# Patient Record
Sex: Female | Born: 1965 | Race: White | Hispanic: No | Marital: Married | Smoking: Never smoker
Health system: Southern US, Community
[De-identification: ages and names within clinical notes are randomized; demographics above are authoritative.]

## PROBLEM LIST (undated history)

## (undated) DIAGNOSIS — N63 Unspecified lump in unspecified breast: Secondary | ICD-10-CM

## (undated) DIAGNOSIS — Z842 Family history of other diseases of the genitourinary system: Secondary | ICD-10-CM

## (undated) DIAGNOSIS — N939 Abnormal uterine and vaginal bleeding, unspecified: Secondary | ICD-10-CM

## (undated) DIAGNOSIS — N920 Excessive and frequent menstruation with regular cycle: Secondary | ICD-10-CM

## (undated) DIAGNOSIS — L900 Lichen sclerosus et atrophicus: Secondary | ICD-10-CM

## (undated) DIAGNOSIS — Z9289 Personal history of other medical treatment: Secondary | ICD-10-CM

## (undated) HISTORY — DX: Personal history of other medical treatment: Z92.89

## (undated) HISTORY — DX: Unspecified lump in unspecified breast: N63.0

## (undated) HISTORY — DX: Abnormal uterine and vaginal bleeding, unspecified: N93.9

## (undated) HISTORY — DX: Excessive and frequent menstruation with regular cycle: N92.0

## (undated) HISTORY — DX: Lichen sclerosus et atrophicus: L90.0

## (undated) HISTORY — DX: Family history of other diseases of the genitourinary system: Z84.2

---

## 1998-06-14 DIAGNOSIS — N63 Unspecified lump in unspecified breast: Secondary | ICD-10-CM

## 1998-06-14 HISTORY — DX: Unspecified lump in unspecified breast: N63.0

## 2004-05-04 ENCOUNTER — Ambulatory Visit: Payer: Self-pay | Admitting: Obstetrics and Gynecology

## 2006-06-01 ENCOUNTER — Ambulatory Visit: Payer: Self-pay | Admitting: Obstetrics and Gynecology

## 2007-08-03 ENCOUNTER — Ambulatory Visit: Payer: Self-pay | Admitting: Obstetrics and Gynecology

## 2007-08-08 ENCOUNTER — Ambulatory Visit: Payer: Self-pay | Admitting: Obstetrics and Gynecology

## 2008-09-24 ENCOUNTER — Ambulatory Visit: Payer: Self-pay

## 2009-10-07 ENCOUNTER — Ambulatory Visit: Payer: Self-pay | Admitting: Obstetrics and Gynecology

## 2010-10-15 ENCOUNTER — Ambulatory Visit: Payer: Self-pay

## 2013-11-08 ENCOUNTER — Ambulatory Visit: Payer: Self-pay

## 2014-03-19 ENCOUNTER — Ambulatory Visit: Payer: Self-pay | Admitting: Obstetrics and Gynecology

## 2014-03-19 LAB — COMPREHENSIVE METABOLIC PANEL
ALT: 26 U/L
Albumin: 4 g/dL (ref 3.4–5.0)
Alkaline Phosphatase: 72 U/L
Anion Gap: 6 — ABNORMAL LOW (ref 7–16)
BUN: 15 mg/dL (ref 7–18)
Bilirubin,Total: 0.3 mg/dL (ref 0.2–1.0)
Calcium, Total: 9.2 mg/dL (ref 8.5–10.1)
Chloride: 104 mmol/L (ref 98–107)
Co2: 29 mmol/L (ref 21–32)
Creatinine: 0.89 mg/dL (ref 0.60–1.30)
EGFR (African American): >60
GLUCOSE: 98 mg/dL (ref 65–99)
Osmolality: 278 (ref 275–301)
Potassium: 3.5 mmol/L (ref 3.5–5.1)
SGOT(AST): 20 U/L (ref 15–37)
SODIUM: 139 mmol/L (ref 136–145)
TOTAL PROTEIN: 7.5 g/dL (ref 6.4–8.2)

## 2014-03-19 LAB — CBC
HCT: 39.1 % (ref 35.0–47.0)
HGB: 12.5 g/dL (ref 12.0–16.0)
MCH: 30.9 pg (ref 26.0–34.0)
MCHC: 31.8 g/dL — ABNORMAL LOW (ref 32.0–36.0)
MCV: 97 fL (ref 80–100)
PLATELETS: 225 10*3/uL (ref 150–440)
RBC: 4.03 10*6/uL (ref 3.80–5.20)
RDW: 12.7 % (ref 11.5–14.5)
WBC: 6 10*3/uL (ref 3.6–11.0)

## 2014-03-26 ENCOUNTER — Ambulatory Visit: Payer: Self-pay | Admitting: Obstetrics and Gynecology

## 2014-03-26 HISTORY — PX: OTHER SURGICAL HISTORY: SHX169

## 2014-03-26 HISTORY — PX: COMBINED HYSTEROSCOPY DIAGNOSTIC / D&C: SUR297

## 2014-03-28 LAB — PATHOLOGY REPORT

## 2014-07-11 HISTORY — PX: COLONOSCOPY: SHX174

## 2014-10-05 NOTE — Op Note (Signed)
PATIENT NAME:  Stacey Case, Stacey Case MR#:  [id number] DATE OF BIRTH:  [birth year]  DATE OF PROCEDURE:  03/26/2014  PREOPERATIVE DIAGNOSES:  1. Menorrhagia with regular cycles.  2. Endometrial polyp.  3. Cervical stenosis.   POSTOPERATIVE DIAGNOSES:  1. Menorrhagia with regular cycles.  2. Endometrial polyp.  3. Cervical stenosis.    PROCEDURES:  1. Dilation and curettage. 2. Hysteroscopy  3. Polypectomy of the endocervix.   ANESTHESIA: General.   SURGEON: Will Bonnet, M.D.    SURGEON: Beacher May, PA-S.  ESTIMATED BLOOD LOSS: 25 mL.   OPERATIVE FLUIDS: 400 mL.   COMPLICATIONS: None.   FINDINGS:  1. Small endocervical polyp.  2. Fluffy endometrium.  3. Stenotic external os of the cervix.   SPECIMENS: Endometrial curettings with endocervical polyp.   CONDITION AT THE END OF THE PROCEDURE: Stable.  FLUID DEFICIT: Negative 10.   URINE OUTPUT: 400 mL clear urine.   PROCEDURE IN DETAIL: The patient was taken to the operating room where general endotracheal anesthesia was administered and found to be adequate. The patient was placed in the dorsal supine high lithotomy position and prepped and draped in the usual sterile fashion. Care was taken to position the patient such that nerve injury risk was minimized. A timeout was called and the patient's bladder was emptied using in and out catheterization. A sterile speculum was placed in the vagina and a single-tooth tenaculum was used to grasp the anterior lip of the cervix. The cervix was then dilated gently in a serial fashion initially using lacrimal duct probes starting with the smallest probe going to the larger size, and then followed by Hegar dilators up to a dilatation of 6 mm. This was accomplished without difficulty. The MyoSure hysteroscope was then introduced through the  endocervix with the above-noted findings. It was then introduced into the uterine cavity with the above-noted findings. The MyoSure device was then  attached to the hysteroscope and the polyp was removed using the MyoSure device, and a sampling of the lining of the endometrium was taken with the MyoSure device, as well.   The device was removed and a curette was used to perform a gentle sampling of the endometrium. The hysteroscope was reintroduced into the cervix and introduced to the uterus to verify no trauma to the uterine cavity and this was verified. Hemostasis was also noted. This terminated the procedure.   The patient tolerated the procedure well. Sponge, lap, and needle counts were correct x 2. For VTE prophylaxis, the patient was wearing pneumatic compression stockings, which were operating throughout the entire procedure. She was awakened in the operating room and taken to the PAC-U in stable condition.     ____________________________ Will Bonnet, MD sdj:JT D: 03/26/2014 09:57:08 ET T: 03/26/2014 10:37:23 ET JOB#: [id number]  cc: Will Bonnet, MD, <Dictator> Will Bonnet MD ELECTRONICALLY SIGNED 04/02/2014 18:55

## 2014-10-29 LAB — HM PAP SMEAR

## 2014-11-04 ENCOUNTER — Other Ambulatory Visit: Payer: Self-pay | Admitting: Unknown Physician Specialty

## 2014-11-04 DIAGNOSIS — R928 Other abnormal and inconclusive findings on diagnostic imaging of breast: Secondary | ICD-10-CM

## 2014-11-08 ENCOUNTER — Ambulatory Visit
Admission: RE | Admit: 2014-11-08 | Discharge: 2014-11-08 | Disposition: A | Discharge status: Home / Self Care | Payer: BC Managed Care – PPO | Source: Ambulatory Visit | Attending: Unknown Physician Specialty | Admitting: Unknown Physician Specialty

## 2014-11-08 DIAGNOSIS — R928 Other abnormal and inconclusive findings on diagnostic imaging of breast: Secondary | ICD-10-CM

## 2014-11-08 DIAGNOSIS — N63 Unspecified lump in breast: Secondary | ICD-10-CM | POA: Insufficient documentation

## 2014-11-08 DIAGNOSIS — R921 Mammographic calcification found on diagnostic imaging of breast: Secondary | ICD-10-CM | POA: Insufficient documentation

## 2014-11-12 ENCOUNTER — Other Ambulatory Visit: Payer: Self-pay | Admitting: Unknown Physician Specialty

## 2014-11-12 DIAGNOSIS — R921 Mammographic calcification found on diagnostic imaging of breast: Secondary | ICD-10-CM

## 2014-11-13 ENCOUNTER — Other Ambulatory Visit: Payer: Self-pay | Admitting: Unknown Physician Specialty

## 2014-11-13 DIAGNOSIS — R921 Mammographic calcification found on diagnostic imaging of breast: Secondary | ICD-10-CM

## 2014-11-19 ENCOUNTER — Ambulatory Visit
Admission: RE | Admit: 2014-11-19 | Discharge: 2014-11-19 | Disposition: A | Discharge status: Home / Self Care | Payer: BC Managed Care – PPO | Source: Ambulatory Visit | Attending: Unknown Physician Specialty | Admitting: Unknown Physician Specialty

## 2014-11-19 DIAGNOSIS — R921 Mammographic calcification found on diagnostic imaging of breast: Secondary | ICD-10-CM

## 2014-11-19 DIAGNOSIS — R928 Other abnormal and inconclusive findings on diagnostic imaging of breast: Secondary | ICD-10-CM | POA: Diagnosis present

## 2014-11-19 DIAGNOSIS — R920 Mammographic microcalcification found on diagnostic imaging of breast: Secondary | ICD-10-CM | POA: Diagnosis not present

## 2014-11-19 DIAGNOSIS — D242 Benign neoplasm of left breast: Secondary | ICD-10-CM | POA: Diagnosis not present

## 2014-11-19 HISTORY — PX: BREAST BIOPSY: SHX20

## 2014-11-20 LAB — SURGICAL PATHOLOGY

## 2015-10-29 ENCOUNTER — Other Ambulatory Visit: Payer: Self-pay | Admitting: Obstetrics and Gynecology

## 2015-10-29 DIAGNOSIS — Z1231 Encounter for screening mammogram for malignant neoplasm of breast: Secondary | ICD-10-CM

## 2015-11-12 ENCOUNTER — Ambulatory Visit
Admission: RE | Admit: 2015-11-12 | Discharge: 2015-11-12 | Disposition: A | Discharge status: Home / Self Care | Payer: BC Managed Care – PPO | Source: Ambulatory Visit | Attending: Obstetrics and Gynecology | Admitting: Obstetrics and Gynecology

## 2015-11-12 DIAGNOSIS — Z1231 Encounter for screening mammogram for malignant neoplasm of breast: Secondary | ICD-10-CM | POA: Insufficient documentation

## 2015-11-12 LAB — HM MAMMOGRAPHY

## 2016-09-30 ENCOUNTER — Other Ambulatory Visit: Payer: Self-pay | Admitting: Obstetrics and Gynecology

## 2016-09-30 DIAGNOSIS — Z1231 Encounter for screening mammogram for malignant neoplasm of breast: Secondary | ICD-10-CM

## 2016-10-27 ENCOUNTER — Telehealth: Payer: Self-pay | Admitting: Obstetrics and Gynecology

## 2016-10-27 DIAGNOSIS — Z13220 Encounter for screening for lipoid disorders: Secondary | ICD-10-CM

## 2016-10-27 DIAGNOSIS — R7303 Prediabetes: Secondary | ICD-10-CM

## 2016-10-27 DIAGNOSIS — Z0000 Encounter for general adult medical examination without abnormal findings: Secondary | ICD-10-CM

## 2016-10-27 NOTE — Telephone Encounter (Signed)
Patient has appt. For Annual on 5/31 , however she would like to know if she can come in before then to get labs done so the results are here at the time of her appt.  Please advise.   If so we need to put her on the lab schedule at some point.   Thanks.

## 2016-10-27 NOTE — Telephone Encounter (Signed)
Left msg for pt to schedule lab appt prior to her appt.

## 2016-10-27 NOTE — Telephone Encounter (Signed)
RN to notify pt she can get fasting labs done at our office at her convenience. Pt to sched lab appt.

## 2016-10-28 NOTE — Telephone Encounter (Signed)
Pt scheduled for lab appt 11/01/16

## 2016-11-01 ENCOUNTER — Other Ambulatory Visit: Payer: BC Managed Care – PPO

## 2016-11-01 DIAGNOSIS — Z0000 Encounter for general adult medical examination without abnormal findings: Secondary | ICD-10-CM

## 2016-11-01 DIAGNOSIS — R7303 Prediabetes: Secondary | ICD-10-CM

## 2016-11-01 DIAGNOSIS — Z13220 Encounter for screening for lipoid disorders: Secondary | ICD-10-CM

## 2016-11-02 LAB — COMPREHENSIVE METABOLIC PANEL
ALK PHOS: 61 IU/L (ref 39–117)
ALT: 18 IU/L (ref 0–32)
AST: 17 IU/L (ref 0–40)
Albumin/Globulin Ratio: 1.5 (ref 1.2–2.2)
Albumin: 4.4 g/dL (ref 3.5–5.5)
BILIRUBIN TOTAL: 0.4 mg/dL (ref 0.0–1.2)
BUN / CREAT RATIO: 18 (ref 9–23)
BUN: 16 mg/dL (ref 6–24)
CO2: 22 mmol/L (ref 18–29)
Calcium: 9.7 mg/dL (ref 8.7–10.2)
Chloride: 101 mmol/L (ref 96–106)
Creatinine, Ser: 0.91 mg/dL (ref 0.57–1.00)
GFR calc Af Amer: 86 mL/min/{1.73_m2} (ref >59)
GFR calc non Af Amer: 74 mL/min/{1.73_m2} (ref >59)
Globulin, Total: 2.9 g/dL (ref 1.5–4.5)
Glucose: 82 mg/dL (ref 65–99)
POTASSIUM: 4.4 mmol/L (ref 3.5–5.2)
Sodium: 140 mmol/L (ref 134–144)
Total Protein: 7.3 g/dL (ref 6.0–8.5)

## 2016-11-02 LAB — LIPID PANEL
CHOLESTEROL TOTAL: 164 mg/dL (ref 100–199)
Chol/HDL Ratio: 2.7 ratio (ref 0.0–4.4)
HDL: 61 mg/dL (ref >39)
LDL Calculated: 85 mg/dL (ref 0–99)
Triglycerides: 88 mg/dL (ref 0–149)
VLDL Cholesterol Cal: 18 mg/dL (ref 5–40)

## 2016-11-02 LAB — HEMOGLOBIN A1C
ESTIMATED AVERAGE GLUCOSE: 120 mg/dL
Hgb A1c MFr Bld: 5.8 % — ABNORMAL HIGH (ref 4.8–5.6)

## 2016-11-11 ENCOUNTER — Ambulatory Visit: Payer: Self-pay | Admitting: Obstetrics and Gynecology

## 2016-11-15 ENCOUNTER — Ambulatory Visit
Admission: RE | Admit: 2016-11-15 | Discharge: 2016-11-15 | Disposition: A | Discharge status: Home / Self Care | Payer: BC Managed Care – PPO | Source: Ambulatory Visit | Attending: Obstetrics and Gynecology | Admitting: Obstetrics and Gynecology

## 2016-11-15 DIAGNOSIS — Z1231 Encounter for screening mammogram for malignant neoplasm of breast: Secondary | ICD-10-CM | POA: Insufficient documentation

## 2016-11-16 ENCOUNTER — Encounter: Payer: Self-pay | Admitting: Obstetrics and Gynecology

## 2016-12-22 ENCOUNTER — Ambulatory Visit (INDEPENDENT_AMBULATORY_CARE_PROVIDER_SITE_OTHER): Payer: BC Managed Care – PPO | Admitting: Obstetrics and Gynecology

## 2016-12-22 ENCOUNTER — Encounter: Payer: Self-pay | Admitting: Obstetrics and Gynecology

## 2016-12-22 VITALS — BP 110/66 | HR 80 | Ht 65.0 in | Wt 200.0 lb

## 2016-12-22 DIAGNOSIS — Z01419 Encounter for gynecological examination (general) (routine) without abnormal findings: Secondary | ICD-10-CM | POA: Diagnosis not present

## 2016-12-22 DIAGNOSIS — N938 Other specified abnormal uterine and vaginal bleeding: Secondary | ICD-10-CM

## 2016-12-22 DIAGNOSIS — Z1231 Encounter for screening mammogram for malignant neoplasm of breast: Secondary | ICD-10-CM | POA: Diagnosis not present

## 2016-12-22 DIAGNOSIS — Z1239 Encounter for other screening for malignant neoplasm of breast: Secondary | ICD-10-CM

## 2016-12-22 NOTE — Progress Notes (Signed)
Chief Complaint  Patient presents with  . Gynecologic Exam    spotty bleeding    HPI:      Ms. Stacey Case is a 51 y.o. G0P0000 who LMP was Patient's last menstrual period was 12/06/2016., presents today for her annual examination.  Her menses are regular every 30-40 days, lasting 5-7 days.  Dysmenorrhea none. She does not have intermenstrual bleeding. She has had DUB this month only. Menses started 12/06/16 and was spotting for about 2 wks, then real flow. Bleeding is just now stopping. She has a hx of endocx polyp with polypectomy 2016 with Dr. Glennon Mac.  Sex activity: single partner, contraception - none. Hx of infertility. She does not have vaginal dryness.  Last Pap: Oct 29, 2014  Results were: no abnormalities /neg HPV DNA.  Hx of STDs: none  Last mammogram: 11/15/16. 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 Results were: normal--routine follow-up in 12 months. There is no FH of breast cancer. There is no FH of ovarian cancer. The patient does do self-breast exams.  Colonoscopy: colonoscopy 2 years ago without abnormalities. FH of polyps so has colonoscopy Q5 yrs.   Tobacco use: The patient denies current or previous tobacco use. Alcohol use: none Exercise: moderately active  She does get adequate calcium and Vitamin D in her diet.   Past Medical History:  Diagnosis Date  . Abnormal uterine bleeding (AUB)   . Breast mass in female 2000   LEFT  . FH: infertility   . History of mammogram 4/22/14L 11/12/15   BIRAD 1; NEG  . History of Papanicolaou smear of cervix 09/20/11; 10/29/14   -/-; -/-  . Lichen sclerosus    VULVA  . Menorrhagia     Past Surgical History:  Procedure Laterality Date  . BREAST BIOPSY Left 11/19/2014   neg  . COLONOSCOPY  07/11/2014   POLYPS - HYPERPLASIA; DR. Aleen Sells  . COMBINED HYSTEROSCOPY DIAGNOSTIC / D&C  03/26/2014   SDJ  . ENDOCERVICAL POLYPECTOMY  03/26/2014   SDJ    Family History  Problem Relation Age of Onset  . Diabetes Maternal Grandfather     TYPE 2  . Breast cancer Neg Hx     Social History   Social History  . Marital status: Married    Spouse name: N/A  . Number of children: 1  . Years of education: 58   Occupational History  . teacher    Social History Main Topics  . Smoking status: Never Smoker  . Smokeless tobacco: Never Used  . Alcohol use No  . Drug use: No  . Sexual activity: Yes    Birth control/ protection: None   Other Topics Concern  . Not on file   Social History Narrative  . No narrative on file    No current outpatient prescriptions on file.   ROS:  Review of Systems  Constitutional: Negative for fatigue, fever and unexpected weight change.  Respiratory: Negative for cough, shortness of breath and wheezing.   Cardiovascular: Negative for chest pain, palpitations and leg swelling.  Gastrointestinal: Negative for blood in stool, constipation, diarrhea, nausea and vomiting.  Endocrine: Negative for cold intolerance, heat intolerance and polyuria.  Genitourinary: Positive for menstrual problem. Negative for dyspareunia, dysuria, flank pain, frequency, genital sores, hematuria, pelvic pain, urgency, vaginal bleeding, vaginal discharge and vaginal pain.  Musculoskeletal: Negative for back pain, joint swelling and myalgias.  Skin: Negative for rash.  Neurological: Negative for dizziness, syncope, light-headedness, numbness and headaches.  Hematological: Negative for adenopathy.  Psychiatric/Behavioral: Negative for agitation, confusion, sleep disturbance and suicidal ideas. The patient is not nervous/anxious.      Objective: BP 110/66 (BP Location: Left Arm, Patient Position: Sitting, Cuff Size: Normal)   Pulse 80   Ht 5\' 5"  (1.651 m)   Wt 200 lb (90.7 kg)   LMP 12/06/2016   BMI 33.28 kg/m    Physical Exam  Constitutional: She is oriented to person, place, and time. She appears well-developed and well-nourished.  Genitourinary: Vagina normal and uterus normal. There is no rash or  tenderness on the right labia. There is no rash or tenderness on the left labia. No erythema or tenderness in the vagina. No vaginal discharge found. Right adnexum does not display mass and does not display tenderness. Left adnexum does not display mass and does not display tenderness. Cervix does not exhibit motion tenderness or polyp. Uterus is not enlarged or tender.  Neck: Normal range of motion. No thyromegaly present.  Cardiovascular: Normal rate, regular rhythm and normal heart sounds.   No murmur heard. Pulmonary/Chest: Effort normal and breath sounds normal. Right breast exhibits no mass, no nipple discharge, no skin change and no tenderness. Left breast exhibits no mass, no nipple discharge, no skin change and no tenderness.  Abdominal: Soft. There is no tenderness. There is no guarding.  Musculoskeletal: Normal range of motion.  Neurological: She is alert and oriented to person, place, and time. No cranial nerve deficit.  Psychiatric: She has a normal mood and affect. Her behavior is normal.  Vitals reviewed.   Assessment/Plan:  Encounter for annual routine gynecological examination  Screening for breast cancer - Pt had mammo 6/18.  DUB (dysfunctional uterine bleeding) - This month only. Probably due to anovulatory bleeding. Hx of endocx polyp with polypectomy in the past. Pt to f/u if sx persist for u/s and labs.          GYN counsel mammography screening, menopause, adequate intake of calcium and vitamin D, diet and exercise     F/U  Return in about 1 year (around 12/22/2017).  Georgene Kopper B. Talyn Dessert, PA-C 12/22/2016 3:14 PM

## 2016-12-30 ENCOUNTER — Telehealth: Payer: Self-pay

## 2016-12-30 NOTE — Telephone Encounter (Signed)
After phonetag, spoke c pt and adv there were no results.  She is not due for pap until next year.  Pt just wanted to be sure.

## 2016-12-30 NOTE — Telephone Encounter (Signed)
Pt calling for results from the 11th.  No labs done. Pap due 2019. [phone] [facility].

## 2017-08-18 ENCOUNTER — Telehealth: Payer: Self-pay

## 2017-08-18 NOTE — Telephone Encounter (Signed)
Pt started cycle 08/09/17 and is still bleeding. She was about 10-15 days later starting this month. Pt isn't bleeding heavy or having to change pad more than 1x qh. Pt aware [facility] out of office til Monday. Ok to wait, just passing info along as requested.

## 2017-08-18 NOTE — Telephone Encounter (Signed)
Pt states she was to notify [facility] if she was having any lengthy time between cycles. [phone]

## 2017-08-22 ENCOUNTER — Other Ambulatory Visit: Payer: Self-pay | Admitting: Obstetrics and Gynecology

## 2017-08-22 DIAGNOSIS — N938 Other specified abnormal uterine and vaginal bleeding: Secondary | ICD-10-CM

## 2017-08-22 NOTE — Telephone Encounter (Signed)
Pt needs labs and GYN u/s for DUB. Orders placed. RN to notify pt and get her to sched. Thx.

## 2017-08-22 NOTE — Progress Notes (Signed)
Pt with DUB sx again. Due for labs/GYN u/s. [facility] will call pt with results.

## 2017-08-24 NOTE — Telephone Encounter (Signed)
Left detailed msg for pt to call and schedule lab and u/s.

## 2017-08-29 ENCOUNTER — Other Ambulatory Visit: Payer: BC Managed Care – PPO

## 2017-08-30 ENCOUNTER — Other Ambulatory Visit: Payer: BC Managed Care – PPO

## 2017-08-30 ENCOUNTER — Ambulatory Visit (INDEPENDENT_AMBULATORY_CARE_PROVIDER_SITE_OTHER): Payer: BC Managed Care – PPO

## 2017-08-30 DIAGNOSIS — N938 Other specified abnormal uterine and vaginal bleeding: Secondary | ICD-10-CM

## 2017-08-31 ENCOUNTER — Telehealth: Payer: Self-pay | Admitting: Obstetrics and Gynecology

## 2017-08-31 LAB — TSH+FREE T4
FREE T4: 1.06 ng/dL (ref 0.82–1.77)
TSH: 2.58 u[IU]/mL (ref 0.450–4.500)

## 2017-08-31 LAB — PROLACTIN: Prolactin: 10.6 ng/mL (ref 4.8–23.3)

## 2017-08-31 NOTE — Telephone Encounter (Addendum)
Pt aware of normal DUB labs. Pt had u/s yesterday which showed poss endocx polyp. EM =2.8 mm. Hx of endocx polyp rem in past with Dr. Glennon Mac. Had DUB sx 7/18 and again 2/19--menses lasted 10 days. Pt wants to watch and wait with sx and will f/u in a couple months at annual.

## 2017-10-25 ENCOUNTER — Other Ambulatory Visit: Payer: Self-pay | Admitting: Obstetrics and Gynecology

## 2017-12-26 ENCOUNTER — Encounter: Payer: Self-pay | Admitting: Obstetrics and Gynecology

## 2017-12-26 ENCOUNTER — Other Ambulatory Visit (HOSPITAL_COMMUNITY)
Admission: RE | Admit: 2017-12-26 | Discharge: 2017-12-26 | Disposition: A | Discharge status: Home / Self Care | Payer: BC Managed Care – PPO | Source: Ambulatory Visit | Attending: Obstetrics and Gynecology | Admitting: Obstetrics and Gynecology

## 2017-12-26 ENCOUNTER — Ambulatory Visit (INDEPENDENT_AMBULATORY_CARE_PROVIDER_SITE_OTHER): Payer: BC Managed Care – PPO | Admitting: Obstetrics and Gynecology

## 2017-12-26 VITALS — BP 130/80 | HR 68 | Ht 66.0 in | Wt 212.0 lb

## 2017-12-26 DIAGNOSIS — N938 Other specified abnormal uterine and vaginal bleeding: Secondary | ICD-10-CM | POA: Diagnosis not present

## 2017-12-26 DIAGNOSIS — Z124 Encounter for screening for malignant neoplasm of cervix: Secondary | ICD-10-CM | POA: Insufficient documentation

## 2017-12-26 DIAGNOSIS — Z01411 Encounter for gynecological examination (general) (routine) with abnormal findings: Secondary | ICD-10-CM | POA: Diagnosis not present

## 2017-12-26 DIAGNOSIS — Z1151 Encounter for screening for human papillomavirus (HPV): Secondary | ICD-10-CM | POA: Insufficient documentation

## 2017-12-26 DIAGNOSIS — N841 Polyp of cervix uteri: Secondary | ICD-10-CM | POA: Diagnosis not present

## 2017-12-26 DIAGNOSIS — Z1231 Encounter for screening mammogram for malignant neoplasm of breast: Secondary | ICD-10-CM | POA: Diagnosis not present

## 2017-12-26 DIAGNOSIS — Z0000 Encounter for general adult medical examination without abnormal findings: Secondary | ICD-10-CM

## 2017-12-26 DIAGNOSIS — Z1239 Encounter for other screening for malignant neoplasm of breast: Secondary | ICD-10-CM

## 2017-12-26 DIAGNOSIS — R490 Dysphonia: Secondary | ICD-10-CM

## 2017-12-26 DIAGNOSIS — Z01419 Encounter for gynecological examination (general) (routine) without abnormal findings: Secondary | ICD-10-CM

## 2017-12-26 DIAGNOSIS — Z13220 Encounter for screening for lipoid disorders: Secondary | ICD-10-CM | POA: Diagnosis not present

## 2017-12-26 DIAGNOSIS — Z131 Encounter for screening for diabetes mellitus: Secondary | ICD-10-CM

## 2017-12-26 NOTE — Patient Instructions (Signed)
I value your feedback and entrusting us with your care. If you get a [facility] patient survey, I would appreciate you taking the time to let us know about your experience today. Thank you! 

## 2017-12-26 NOTE — Progress Notes (Signed)
S   Chief Complaint  Patient presents with  . Gynecologic Exam    Discharge after period      HPI:      Stacey Case is a 52 y.o. G0P0000 who LMP was Patient's last menstrual period was 12/14/2017 (approximate)., presents today for her annual examination. Her menses are regular every 30-40 days, lasting 5-7 days.  Dysmenorrhea and HA day before. She has had occas intermenstrual bleeding since last yr. Normal thyroid labs. Has a hx of endocx polyp with polypectomy 2016 with Dr. Glennon Mac. GYN u/s 3/19 showed "Avascular area in cervix, possible cervical polyp measures 1.71 x 1.27 cm. Endometrium measures 2.82 mm". Pt had wanted to follow sx. Started spotting again today. She does have vasomotor sx.   Sex activity: single partner, contraception - none. Hx of infertility. She does not have vaginal dryness.  Last Pap: Oct 29, 2014  Results were: no abnormalities /neg HPV DNA.  Hx of STDs: none  Last mammogram: 11/15/16. 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 Results were: normal--routine follow-up in 12 months. There is no FH of breast cancer. There is no FH of ovarian cancer. The patient does do self-breast exams.  Colonoscopy: colonoscopy 3 years ago without abnormalities. FH of polyps so has colonoscopy Q5 yrs.   Tobacco use: The patient denies current or previous tobacco use. Alcohol use: none Exercise: moderately active  She does get adequate calcium and Vitamin D in her diet. WNL lipids 5/18 but mild pre-DM. Due for lab rechk.  Doesn't have PCP.  Has had throat hoarseness intermittently recently. Has noted some nasal drainage. No meds taken. Had GERD sx last night but that is unusual for pt. Had normal thyroid 3/19.     Past Medical History:  Diagnosis Date  . Abnormal uterine bleeding (AUB)   . Breast mass in female 2000   LEFT  . FH: infertility   . History of mammogram 4/22/14L 11/12/15   BIRAD 1; NEG  . History of Papanicolaou smear of cervix 09/20/11; 10/29/14   -/-; -/-  .  Lichen sclerosus    VULVA  . Menorrhagia     Past Surgical History:  Procedure Laterality Date  . BREAST BIOPSY Left 11/19/2014   neg  . COLONOSCOPY  07/11/2014   POLYPS - HYPERPLASIA; DR. Aleen Sells  . COMBINED HYSTEROSCOPY DIAGNOSTIC / D&C  03/26/2014   SDJ  . ENDOCERVICAL POLYPECTOMY  03/26/2014   SDJ    Family History  Problem Relation Age of Onset  . Diabetes Maternal Grandfather        TYPE 2  . Breast cancer Neg Hx     Social History   Socioeconomic History  . Marital status: Married    Spouse name: Not on file  . Number of children: 1  . Years of education: 30  . Highest education level: Not on file  Occupational History  . Occupation: Pharmacist, hospital  Social Needs  . Financial resource strain: Not on file  . Food insecurity:    Worry: Not on file    Inability: Not on file  . Transportation needs:    Medical: Not on file    Non-medical: Not on file  Tobacco Use  . Smoking status: Never Smoker  . Smokeless tobacco: Never Used  Substance and Sexual Activity  . Alcohol use: No  . Drug use: No  . Sexual activity: Yes    Birth control/protection: None  Lifestyle  . Physical activity:    Days per week: Not on file  Minutes per session: Not on file  . Stress: Not on file  Relationships  . Social connections:    Talks on phone: Not on file    Gets together: Not on file    Attends religious service: Not on file    Active member of club or organization: Not on file    Attends meetings of clubs or organizations: Not on file    Relationship status: Not on file  . Intimate partner violence:    Fear of current or ex partner: Not on file    Emotionally abused: Not on file    Physically abused: Not on file    Forced sexual activity: Not on file  Other Topics Concern  . Not on file  Social History Narrative  . Not on file    Current Outpatient Medications on File Prior to Visit  Medication Sig Dispense Refill  . Multiple Vitamin (MULTI-VITAMIN DAILY) TABS Take  by mouth.     No current facility-administered medications on file prior to visit.     ROS:  Review of Systems  Constitutional: Negative for fatigue, fever and unexpected weight change.  Respiratory: Negative for cough, shortness of breath and wheezing.   Cardiovascular: Negative for chest pain, palpitations and leg swelling.  Gastrointestinal: Negative for blood in stool, constipation, diarrhea, nausea and vomiting.  Endocrine: Negative for cold intolerance, heat intolerance and polyuria.  Genitourinary: Positive for vaginal bleeding. Negative for dyspareunia, dysuria, flank pain, frequency, genital sores, hematuria, menstrual problem, pelvic pain, urgency, vaginal discharge and vaginal pain.  Musculoskeletal: Negative for back pain, joint swelling and myalgias.  Skin: Negative for rash.  Neurological: Negative for dizziness, syncope, light-headedness, numbness and headaches.  Hematological: Negative for adenopathy.  Psychiatric/Behavioral: Negative for agitation, confusion, sleep disturbance and suicidal ideas. The patient is not nervous/anxious.      Objective: BP 130/80   Pulse 68   Ht 5\' 6"  (1.676 m)   Wt 212 lb (96.2 kg)   LMP 12/14/2017 (Approximate)   BMI 34.22 kg/m    Physical Exam  Constitutional: She is oriented to person, place, and time. She appears well-developed and well-nourished.  Genitourinary: Uterus normal. There is no rash or tenderness on the right labia. There is no rash or tenderness on the left labia. There is bleeding in the vagina. No erythema or tenderness in the vagina. No vaginal discharge found. Right adnexum does not display mass and does not display tenderness. Left adnexum does not display mass and does not display tenderness. Cervix does not exhibit motion tenderness or polyp. Uterus is not enlarged or tender.  Neck: Normal range of motion. No thyromegaly present.  Cardiovascular: Normal rate, regular rhythm and normal heart sounds.  No murmur  heard. Pulmonary/Chest: Effort normal and breath sounds normal. Right breast exhibits no mass, no nipple discharge, no skin change and no tenderness. Left breast exhibits no mass, no nipple discharge, no skin change and no tenderness.  Abdominal: Soft. There is no tenderness. There is no guarding.  Musculoskeletal: Normal range of motion.  Neurological: She is alert and oriented to person, place, and time. No cranial nerve deficit.  Psychiatric: She has a normal mood and affect. Her behavior is normal.  Vitals reviewed.   Assessment/Plan: Encounter for annual routine gynecological examination  Cervical cancer screening - Plan: Cytology - PAP  Screening for HPV (human papillomavirus) - Plan: Cytology - PAP  Screening for breast cancer - Pt to sched mammo.  - Plan: MM 3D SCREEN BREAST BILATERAL  DUB (  dysfunctional uterine bleeding) - Most likely due to endocx polyp on 3/19 u/s.   Endocervical polyp - F/u with Dr. Glennon Mac for further eval/mgmt.  Blood tests for routine general physical examination - Plan: Comprehensive metabolic panel, Lipid panel, Hemoglobin A1c  Screening cholesterol level - Plan: Lipid panel  Screening for diabetes mellitus - Plan: Hemoglobin A1c  Hoarseness of voice - Question PND. Try claritin/zyrtec. If no relief, f/u with PCP for further eval. Question GERD. Neg thyroid lab 3/19.            GYN counsel breast self exam, mammography screening, menopause, adequate intake of calcium and vitamin D, diet and exercise     F/U  Return in about 1 week (around 01/02/2018) for with Dr. Glennon Mac for endocx polyp conf.  Stacey B. Copland, PA-C 12/26/2017 3:55 PM

## 2017-12-28 LAB — CYTOLOGY - PAP
Diagnosis: NEGATIVE
HPV: NOT DETECTED

## 2017-12-29 ENCOUNTER — Telehealth: Payer: Self-pay | Admitting: Obstetrics and Gynecology

## 2017-12-29 ENCOUNTER — Other Ambulatory Visit: Payer: BC Managed Care – PPO

## 2017-12-29 DIAGNOSIS — Z131 Encounter for screening for diabetes mellitus: Secondary | ICD-10-CM

## 2017-12-29 DIAGNOSIS — Z13220 Encounter for screening for lipoid disorders: Secondary | ICD-10-CM

## 2017-12-29 DIAGNOSIS — Z0000 Encounter for general adult medical examination without abnormal findings: Secondary | ICD-10-CM

## 2017-12-29 NOTE — Telephone Encounter (Signed)
Pt calling about results from Monday.

## 2017-12-29 NOTE — Telephone Encounter (Signed)
Reviewed Chart. Pap results from Monday normal/negative. Signed by [facility] 12/28/17. Spoke w/pt to notify.

## 2017-12-29 NOTE — Telephone Encounter (Signed)
Pt came in asking about results. Please advise

## 2017-12-30 LAB — COMPREHENSIVE METABOLIC PANEL
ALT: 15 IU/L (ref 0–32)
AST: 15 IU/L (ref 0–40)
Albumin/Globulin Ratio: 2 (ref 1.2–2.2)
Albumin: 4.3 g/dL (ref 3.5–5.5)
Alkaline Phosphatase: 57 IU/L (ref 39–117)
BUN/Creatinine Ratio: 17 (ref 9–23)
BUN: 15 mg/dL (ref 6–24)
Bilirubin Total: 0.3 mg/dL (ref 0.0–1.2)
CALCIUM: 9.1 mg/dL (ref 8.7–10.2)
CO2: 25 mmol/L (ref 20–29)
CREATININE: 0.9 mg/dL (ref 0.57–1.00)
Chloride: 101 mmol/L (ref 96–106)
GFR calc Af Amer: 86 mL/min/{1.73_m2} (ref >59)
GFR, EST NON AFRICAN AMERICAN: 74 mL/min/{1.73_m2} (ref >59)
Globulin, Total: 2.2 g/dL (ref 1.5–4.5)
Glucose: 90 mg/dL (ref 65–99)
Potassium: 4.4 mmol/L (ref 3.5–5.2)
Sodium: 141 mmol/L (ref 134–144)
TOTAL PROTEIN: 6.5 g/dL (ref 6.0–8.5)

## 2017-12-30 LAB — LIPID PANEL
CHOL/HDL RATIO: 2.6 ratio (ref 0.0–4.4)
Cholesterol, Total: 163 mg/dL (ref 100–199)
HDL: 63 mg/dL (ref >39)
LDL CALC: 83 mg/dL (ref 0–99)
Triglycerides: 83 mg/dL (ref 0–149)
VLDL CHOLESTEROL CAL: 17 mg/dL (ref 5–40)

## 2017-12-30 LAB — HEMOGLOBIN A1C
Est. average glucose Bld gHb Est-mCnc: 117 mg/dL
HEMOGLOBIN A1C: 5.7 % — AB (ref 4.8–5.6)

## 2018-01-05 ENCOUNTER — Ambulatory Visit (INDEPENDENT_AMBULATORY_CARE_PROVIDER_SITE_OTHER): Payer: BC Managed Care – PPO | Admitting: Obstetrics and Gynecology

## 2018-01-05 ENCOUNTER — Ambulatory Visit
Admission: RE | Admit: 2018-01-05 | Discharge: 2018-01-05 | Disposition: A | Discharge status: Home / Self Care | Payer: BC Managed Care – PPO | Source: Ambulatory Visit | Attending: Obstetrics and Gynecology | Admitting: Obstetrics and Gynecology

## 2018-01-05 ENCOUNTER — Encounter: Payer: Self-pay | Admitting: Obstetrics and Gynecology

## 2018-01-05 ENCOUNTER — Other Ambulatory Visit: Payer: Self-pay | Admitting: Obstetrics and Gynecology

## 2018-01-05 VITALS — BP 122/64 | HR 68 | Ht 66.0 in | Wt 213.0 lb

## 2018-01-05 DIAGNOSIS — N889 Noninflammatory disorder of cervix uteri, unspecified: Secondary | ICD-10-CM

## 2018-01-05 DIAGNOSIS — Z1231 Encounter for screening mammogram for malignant neoplasm of breast: Secondary | ICD-10-CM | POA: Diagnosis not present

## 2018-01-05 DIAGNOSIS — N6489 Other specified disorders of breast: Secondary | ICD-10-CM

## 2018-01-05 DIAGNOSIS — Z1239 Encounter for other screening for malignant neoplasm of breast: Secondary | ICD-10-CM

## 2018-01-05 DIAGNOSIS — R928 Other abnormal and inconclusive findings on diagnostic imaging of breast: Secondary | ICD-10-CM

## 2018-01-05 NOTE — Progress Notes (Signed)
Obstetrics & Gynecology Office Visit   Chief Complaint  Patient presents with  . Gyn visit  conference for endocervical polyp  History of Present Illness: 52 y.o. [place] female who has a history of an endocervical polypectomy in 2016. She had a pelvic ultrasound in 08/2017 that showed an avascular endocervical structure that measured 1.7 x 1.3 cm.  She was having intermenstrual spotting that lasted a few days.  It is difficult for her to tell because her cycles are not regular.  She describes the bleeding as brownish.  She had a pap smear on 12/26/17 that was negative with HPV negative.    Past Medical History:  Diagnosis Date  . Abnormal uterine bleeding (AUB)   . Breast mass in female 2000   LEFT  . FH: infertility   . History of mammogram 4/22/14L 11/12/15   BIRAD 1; NEG  . History of Papanicolaou smear of cervix 09/20/11; 10/29/14   -/-; -/-  . Lichen sclerosus    VULVA  . Menorrhagia     Past Surgical History:  Procedure Laterality Date  . BREAST BIOPSY Left 11/19/2014   bx/clip- neg  . COLONOSCOPY  07/11/2014   POLYPS - HYPERPLASIA; DR. Aleen Sells  . COMBINED HYSTEROSCOPY DIAGNOSTIC / D&C  03/26/2014   SDJ  . ENDOCERVICAL POLYPECTOMY  03/26/2014   SDJ    Gynecologic History: Patient's last menstrual period was 12/16/2017.  Obstetric History: G0P0000  Family History  Problem Relation Age of Onset  . Diabetes Maternal Grandfather        TYPE 2  . Breast cancer Neg Hx     Social History   Socioeconomic History  . Marital status: Married    Spouse name: Not on file  . Number of children: 1  . Years of education: 59  . Highest education level: Not on file  Occupational History  . Occupation: Pharmacist, hospital  Social Needs  . Financial resource strain: Not on file  . Food insecurity:    Worry: Not on file    Inability: Not on file  . Transportation needs:    Medical: Not on file    Non-medical: Not on file  Tobacco Use  . Smoking status: Never Smoker  . Smokeless  tobacco: Never Used  Substance and Sexual Activity  . Alcohol use: No  . Drug use: No  . Sexual activity: Yes    Birth control/protection: None  Lifestyle  . Physical activity:    Days per week: Not on file    Minutes per session: Not on file  . Stress: Not on file  Relationships  . Social connections:    Talks on phone: Not on file    Gets together: Not on file    Attends religious service: Not on file    Active member of club or organization: Not on file    Attends meetings of clubs or organizations: Not on file    Relationship status: Not on file  . Intimate partner violence:    Fear of current or ex partner: Not on file    Emotionally abused: Not on file    Physically abused: Not on file    Forced sexual activity: Not on file  Other Topics Concern  . Not on file  Social History Narrative  . Not on file    No Known Allergies  Prior to Admission medications   Medication Sig Start Date End Date Taking? Authorizing Provider  Cholecalciferol (VITAMIN D3 PO) Take 1 tablet by mouth daily.  Yes [provider]  loratadine (CLARITIN) 10 MG tablet Take 10 mg by mouth daily.   Yes [provider]  Multiple Vitamin (MULTI-VITAMIN DAILY) TABS Take by mouth.   Yes [provider]    Review of Systems  Constitutional: Negative.   HENT: Negative.   Eyes: Negative.   Respiratory: Negative.   Cardiovascular: Negative.   Gastrointestinal: Negative.   Genitourinary: Negative.        See HPI  Musculoskeletal: Negative.   Skin: Negative.   Neurological: Negative.   Psychiatric/Behavioral: Negative.      Physical Exam BP 122/64 (BP Location: Left Arm, Patient Position: Sitting, Cuff Size: Large)   Pulse 68   Ht 5\' 6"  (1.676 m)   Wt 213 lb (96.6 kg)   LMP 12/16/2017   SpO2 97%   BMI 34.38 kg/m  Patient's last menstrual period was 12/16/2017. Physical Exam  Constitutional: She is oriented to person, place, and time. She appears well-developed and  well-nourished. No distress.  HENT:  Head: Normocephalic and atraumatic.  Eyes: Conjunctivae are normal. No scleral icterus.  Musculoskeletal: Normal range of motion. She exhibits no edema.  Neurological: She is alert and oriented to person, place, and time. No cranial nerve deficit.  Psychiatric: She has a normal mood and affect. Her behavior is normal. Judgment normal.   Assessment: 52 y.o. G0P0000 female here for  1. Cervical lesion      Plan: Problem List Items Addressed This Visit      Other   Cervical lesion - Primary   Relevant Orders   US PELVIS TRANSVANGINAL NON-OB (TV ONLY)     Discussed differential for her symptoms and findings from ultrasound in March this year.  Discussed she could have recurrence of endocervical polyp.  Also, discussed that given her history of cervical stenosis (required lacrimal duct probes to initiate dilation of her cervix during surgery in 2016), that what was seen on ultrasound could be well-organized blood clot. Discussed repeating her ultrasound and considering a dilation procedure in office with likely endometrial biopsy versus returning to the OR for dilation and curettage and hysteroscopy and removal of abnormal tissue. She would like to pursue the less aggressive option. Will start with a pelvic ultrasound and consider dilation, in office, should her ultrasound be similar or equivocal.   20 minutes spent in face to face discussion with > 50% spent in counseling,management, and coordination of care of her cervical lesion.   Prentice Docker, MD 01/05/2018 1:00 PM

## 2018-01-10 ENCOUNTER — Telehealth: Payer: Self-pay

## 2018-01-10 NOTE — Telephone Encounter (Signed)
Pt calling for Mammogram results. [phone]

## 2018-01-10 NOTE — Telephone Encounter (Signed)
Spoke with pt. She needs addl views but hasn't heard from [facility]. Spoke with Langley Gauss at [place]. Pt is to be called today. Will f/u with results.

## 2018-01-11 ENCOUNTER — Encounter: Payer: Self-pay | Admitting: Obstetrics and Gynecology

## 2018-01-11 ENCOUNTER — Ambulatory Visit
Admission: RE | Admit: 2018-01-11 | Discharge: 2018-01-11 | Disposition: A | Discharge status: Home / Self Care | Payer: BC Managed Care – PPO | Source: Ambulatory Visit | Attending: Obstetrics and Gynecology | Admitting: Obstetrics and Gynecology

## 2018-01-11 DIAGNOSIS — N6489 Other specified disorders of breast: Secondary | ICD-10-CM

## 2018-01-11 DIAGNOSIS — R928 Other abnormal and inconclusive findings on diagnostic imaging of breast: Secondary | ICD-10-CM | POA: Diagnosis not present

## 2018-01-13 ENCOUNTER — Other Ambulatory Visit: Payer: BC Managed Care – PPO

## 2018-01-13 ENCOUNTER — Ambulatory Visit: Payer: BC Managed Care – PPO

## 2018-01-16 ENCOUNTER — Ambulatory Visit (INDEPENDENT_AMBULATORY_CARE_PROVIDER_SITE_OTHER): Payer: BC Managed Care – PPO | Admitting: Obstetrics and Gynecology

## 2018-01-16 ENCOUNTER — Ambulatory Visit (INDEPENDENT_AMBULATORY_CARE_PROVIDER_SITE_OTHER): Payer: BC Managed Care – PPO

## 2018-01-16 ENCOUNTER — Encounter: Payer: Self-pay | Admitting: Obstetrics and Gynecology

## 2018-01-16 VITALS — BP 122/84 | HR 80 | Ht 66.0 in | Wt 215.0 lb

## 2018-01-16 DIAGNOSIS — N889 Noninflammatory disorder of cervix uteri, unspecified: Secondary | ICD-10-CM

## 2018-01-16 DIAGNOSIS — D252 Subserosal leiomyoma of uterus: Secondary | ICD-10-CM

## 2018-01-16 NOTE — Progress Notes (Signed)
Gynecology Ultrasound Follow Up   Chief Complaint  Patient presents with  . Follow-up  history of cervical polyp   History of Present Illness: Patient is a 52 y.o. female who presents today for ultrasound evaluation of the above .  Ultrasound demonstrates the following findings Adnexa: no masses seen  Uterus: anteverted with endometrial stripe  4.1 mm Additional: Anterior fundal fibroid (9.6 x 9.3 mm), fundal subserosal fibroid (14.4 x 10.3 mm) Small collection of fluid in cervix of undetermined significance.  Her bleeding overall is very light.  Her pap smear was normal. No definitive  Evidence of endocervical polyp and certainly the same structure noted in March is no obviously present.   Past Medical History:  Diagnosis Date  . Abnormal uterine bleeding (AUB)   . Breast mass in female 2000   LEFT  . FH: infertility   . History of mammogram 4/22/14L 11/12/15   BIRAD 1; NEG  . History of Papanicolaou smear of cervix 09/20/11; 10/29/14   -/-; -/-  . Lichen sclerosus    VULVA  . Menorrhagia     Past Surgical History:  Procedure Laterality Date  . BREAST BIOPSY Left 11/19/2014   bx/clip- neg  . COLONOSCOPY  07/11/2014   POLYPS - HYPERPLASIA; DR. Aleen Sells  . COMBINED HYSTEROSCOPY DIAGNOSTIC / D&C  03/26/2014   SDJ  . ENDOCERVICAL POLYPECTOMY  03/26/2014   SDJ    Family History  Problem Relation Age of Onset  . Diabetes Maternal Grandfather        TYPE 2  . Breast cancer Neg Hx     Social History   Socioeconomic History  . Marital status: Married    Spouse name: Not on file  . Number of children: 1  . Years of education: 5  . Highest education level: Not on file  Occupational History  . Occupation: Pharmacist, hospital  Social Needs  . Financial resource strain: Not on file  . Food insecurity:    Worry: Not on file    Inability: Not on file  . Transportation needs:    Medical: Not on file    Non-medical: Not on file  Tobacco Use  . Smoking status: Never Smoker  .  Smokeless tobacco: Never Used  Substance and Sexual Activity  . Alcohol use: No  . Drug use: No  . Sexual activity: Yes    Birth control/protection: None  Lifestyle  . Physical activity:    Days per week: Not on file    Minutes per session: Not on file  . Stress: Not on file  Relationships  . Social connections:    Talks on phone: Not on file    Gets together: Not on file    Attends religious service: Not on file    Active member of club or organization: Not on file    Attends meetings of clubs or organizations: Not on file    Relationship status: Not on file  . Intimate partner violence:    Fear of current or ex partner: Not on file    Emotionally abused: Not on file    Physically abused: Not on file    Forced sexual activity: Not on file  Other Topics Concern  . Not on file  Social History Narrative  . Not on file    No Known Allergies  Prior to Admission medications   Medication Sig Start Date End Date Taking? Authorizing Provider  Cholecalciferol (VITAMIN D3 PO) Take 1 tablet by mouth daily.   Yes [provider]  loratadine (CLARITIN) 10 MG tablet Take 10 mg by mouth daily.   Yes [provider]  Multiple Vitamin (MULTI-VITAMIN DAILY) TABS Take by mouth.   Yes [provider]    Physical Exam BP 122/84 (BP Location: Left Arm, Patient Position: Sitting, Cuff Size: Large)   Pulse 80   Ht 5\' 6"  (1.676 m)   Wt 215 lb (97.5 kg)   SpO2 99%   BMI 34.70 kg/m    General: NAD HEENT: normocephalic, anicteric Pulmonary: No increased work of breathing Extremities: no edema, erythema, or tenderness Neurologic: Grossly intact, normal gait Psychiatric: mood appropriate, affect full  US Pelvis Transvanginal Non-ob (tv Only)  Result Date: 01/16/2018 ULTRASOUND REPORT Location: [facility] Date of Service: 01/16/2018 Patient Name: Stacey Case DOB: 08-20-1965 MRN: [id number] Indications:Cervical lesion follow up Findings: The uterus is  anteverted and measures 6.90 x 3.84 x 2.80 cm. Echo texture is heterogenous with evidence of focal masses. Within the uterus are multiple suspected fibroids measuring: Fibroid 1: Anterior subserosal measures 9.58 x 9.33 mm Fibroid 2: Fundal subserosal measures 14.36 x 10.34 mm The Endometrium measures 4.10 mm. Right Ovary measures 1.73 x 1.31 x 0.95 cm. It is normal in appearance. Left Ovary measures 2.73 x 1.63 x 1.16 cm. It is normal in appearance. Survey of the adnexa demonstrates no adnexal masses. There is no free fluid in the cul de sac. The cervix does not have a similar mass as compared to her prior imaging. There is as small fluid collection of undetermined significance in the cervix.  Impression: 1. Anteverted uterus with heterogenous texture. 2. Possible fibroids are seen. 3. No apparent cervical polyp. However, there is a small collection of fluid within the cervix of undetermined significance. Recommendations: 1.Clinical correlation with the patient's History and Physical Exam. Stacey Case, RDMS The ultrasound images and findings were reviewed by me and I agree with the above report. Stacey Docker, MD, [facility], [facility] 01/16/2018 12:27 PM    Assessment: 52 y.o. G0P0000 here with  1. Cervical lesion      Plan: Problem List Items Addressed This Visit      Other   Cervical lesion - Primary     Discussed interval change in characteristics noted on ultrasound in March from today.  Discussed that she could have a cervical polyp. She also has cervical stenosis and the fluid and lesion noted on ultrasound could be a result of blood in the cervix. Offered to investigate further surgically or revisit in six months with repeat ultrasound. She elects to monitor. If symptoms worsen in the that time, will have her return to clinic to investigate again with possible surgery.  Mutual decision made for this course of action.   15 minutes spent in face to face  discussion with > 50% spent in counseling,management, and coordination of care of her cervical lesion.   Stacey Docker, MD, [facility], [facility] 01/16/2018 1:40 PM

## 2018-03-16 ENCOUNTER — Telehealth: Payer: Self-pay

## 2018-03-16 NOTE — Telephone Encounter (Signed)
Pt calling to discuss continued symptoms of premenopause w/[facility]. [phone]

## 2018-03-16 NOTE — Telephone Encounter (Signed)
Pt states she hasn't had a period since last Tuesday of August. She did have a little BTB between. Now she is just having a clear to white d/c with some mild cramping around the time that she should have had her period. She knows [facility] said that her cycle would be irregular, but she just wanted to relay her current situation & discuss w/[facility]. Pt aware [facility] out of office til Monday.

## 2018-03-17 NOTE — Telephone Encounter (Signed)
It is normal and expected to miss or be late with periods. Concern is if bleeding too frequently or excessively. RN to notify pt.

## 2018-03-20 NOTE — Telephone Encounter (Signed)
Called pt, no answer. Voice mail box full, couldn't leave msg.

## 2018-04-03 NOTE — Telephone Encounter (Signed)
Pt still hasn't had a cycle.  She has never gone this long in between cycles.  Would like to speak c [facility].  [phone]

## 2018-04-03 NOTE — Telephone Encounter (Signed)
RN to notify pt that she is supposed to skip periods as it tapers off for menopause, eventually not having periods at all. This is normal. Bleeding too frequently or excessively is what is abnormal.

## 2018-04-03 NOTE — Telephone Encounter (Signed)
Please advise 

## 2018-04-04 NOTE — Telephone Encounter (Signed)
Called pt and couldn't leave msg, box full.

## 2018-04-04 NOTE — Telephone Encounter (Signed)
Pt aware. She says she has been having clear discharge for a few weeks now, unusual for her to last this long, no odor or itchiness.

## 2018-04-04 NOTE — Telephone Encounter (Signed)
Ok. Sx normal.

## 2018-04-27 ENCOUNTER — Telehealth: Payer: Self-pay

## 2018-04-27 NOTE — Telephone Encounter (Signed)
Pt hasn't had a menstrual cycle since 02/05/18.  Does she need to come back in and check [place] levels again or what to do?  [phone]

## 2018-04-27 NOTE — Telephone Encounter (Signed)
Please advise 

## 2018-04-27 NOTE — Telephone Encounter (Signed)
Spoke with pt. Normal sx. Reassurance.

## 2018-05-24 ENCOUNTER — Telehealth: Payer: Self-pay

## 2018-05-24 NOTE — Telephone Encounter (Signed)
How long has she had sx? Any fevers? Face/teeth or ear pain? RN to clarify

## 2018-05-24 NOTE — Telephone Encounter (Addendum)
Called pt and couldn't leave vm since voicemail full.

## 2018-05-24 NOTE — Telephone Encounter (Signed)
Please advise 

## 2018-05-24 NOTE — Telephone Encounter (Signed)
Pt states she spoke c [facility] about sending in rx if OTC meds didn't work.  Pt has H/As, sore throat, chest hurting, coughing up yellow chunks.  Please call.  [phone]

## 2018-05-25 NOTE — Telephone Encounter (Signed)
Pt calling again.  [phone]

## 2018-05-25 NOTE — Telephone Encounter (Signed)
Called pt, again LVM.

## 2018-05-26 ENCOUNTER — Other Ambulatory Visit: Payer: Self-pay | Admitting: Obstetrics and Gynecology

## 2018-05-26 MED ORDER — AZITHROMYCIN 250 MG PO TABS: ORAL_TABLET | ORAL | 0 refills | Status: DC

## 2018-05-26 NOTE — Telephone Encounter (Signed)
Pt called office stating she hasnt her back from [place], I told her we've been calling her and takes [place] straight to vm and cant leave vm since box is full. She says she has ear pain and dizziness, no fever, today is 4th day.

## 2018-05-26 NOTE — Telephone Encounter (Signed)
Called pt and LVMTRC.

## 2018-05-26 NOTE — Telephone Encounter (Signed)
Rx zpak eRxd.

## 2018-05-26 NOTE — Progress Notes (Signed)
Rx zpak for ear pain/cough, sx lasted over 2 wks from URI.

## 2018-05-26 NOTE — Telephone Encounter (Signed)
Pt aware.

## 2018-05-31 ENCOUNTER — Telehealth: Payer: Self-pay

## 2018-05-31 NOTE — Telephone Encounter (Signed)
Pt aware and states she is doing better with the abx. She said its not much in chest anymore, its more in head now.

## 2018-05-31 NOTE — Telephone Encounter (Signed)
Is she getting any better with abx, worse, or sx staying the same? Zpak is given for 5 days but works for 10 days. If no better, then needs to go to PCP or urgent care for further eval.

## 2018-05-31 NOTE — Telephone Encounter (Signed)
Pt took the last dose of antibx today.  Still having sxs - coughing up green and yellow, coughing a lot, is also blowing out green and yellow.  Does she need to come in or what to do?  [phone]

## 2018-05-31 NOTE — Telephone Encounter (Signed)
Called pt, [place].

## 2018-05-31 NOTE — Telephone Encounter (Signed)
Please advise 

## 2018-12-27 NOTE — Patient Instructions (Addendum)
I value your feedback and entrusting us with your care. If you get a [facility] patient survey, I would appreciate you taking the time to let us know about your experience today. Thank you!  [facility]: [phone]    

## 2018-12-27 NOTE — Progress Notes (Signed)
Chief Complaint  Patient presents with  . Gynecologic Exam    HPI:      Ms. Stacey Case is a 53 y.o. G0P0000 who LMP was No LMP recorded. (Menstrual status: Perimenopausal)., presents today for her annual examination. Her menses are Q1-3 months now, lasting 3-4 days. Mild dysmen, improved with ibup. No longer has BTB. Hx of endocx polyp with polypectomy 2016 with Dr. Glennon Mac. GYN u/s 3/19 showed possible cervical polyp. Saw Dr. Glennon Mac 8/19 and they decided to follow sx, which have since resolved. She does have tolerable vasomotor sx.   Sex activity: single partner, contraception - none. Hx of infertility. She does not have vaginal dryness.  Last Pap: 12/26/17  Results were: no abnormalities /neg HPV DNA.  Hx of STDs: none  Last mammogram: 01/11/18 Results were: normal--routine follow-up in 12 months. 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 There is no FH of breast cancer. There is no FH of ovarian cancer. The patient does do self-breast exams.  Colonoscopy: colonoscopy 4 years ago without abnormalities. FH of polyps so has colonoscopy Q5 yrs.  Tobacco use: The patient denies current or previous tobacco use. Alcohol use: none Exercise: moderately active  She does get adequate calcium and Vitamin D in her diet. WNL lipids 2019 but mild pre-DM. Due for lab rechk.    Past Medical History:  Diagnosis Date  . Abnormal uterine bleeding (AUB)   . Breast mass in female 2000   LEFT  . FH: infertility   . History of mammogram 4/22/14L 11/12/15   BIRAD 1; NEG  . History of Papanicolaou smear of cervix 09/20/11; 10/29/14   -/-; -/-  . Lichen sclerosus    VULVA  . Menorrhagia     Past Surgical History:  Procedure Laterality Date  . BREAST BIOPSY Left 11/19/2014   bx/clip- neg  . COLONOSCOPY  07/11/2014   POLYPS - HYPERPLASIA; DR. Aleen Sells  . COMBINED HYSTEROSCOPY DIAGNOSTIC / D&C  03/26/2014   SDJ  . ENDOCERVICAL POLYPECTOMY  03/26/2014   SDJ    Family History  Problem Relation Age of Onset   . Diabetes Maternal Grandfather        TYPE 2  . Breast cancer Neg Hx     Social History   Socioeconomic History  . Marital status: Married    Spouse name: Not on file  . Number of children: 1  . Years of education: 10  . Highest education level: Not on file  Occupational History  . Occupation: Pharmacist, hospital  Social Needs  . Financial resource strain: Not on file  . Food insecurity    Worry: Not on file    Inability: Not on file  . Transportation needs    Medical: Not on file    Non-medical: Not on file  Tobacco Use  . Smoking status: Never Smoker  . Smokeless tobacco: Never Used  Substance and Sexual Activity  . Alcohol use: No  . Drug use: No  . Sexual activity: Yes    Birth control/protection: None  Lifestyle  . Physical activity    Days per week: Not on file    Minutes per session: Not on file  . Stress: Not on file  Relationships  . Social Herbalist on phone: Not on file    Gets together: Not on file    Attends religious service: Not on file    Active member of club or organization: Not on file    Attends meetings of clubs or organizations:  Not on file    Relationship status: Not on file  . Intimate partner violence    Fear of current or ex partner: Not on file    Emotionally abused: Not on file    Physically abused: Not on file    Forced sexual activity: Not on file  Other Topics Concern  . Not on file  Social History Narrative  . Not on file    Current Outpatient Medications on File Prior to Visit  Medication Sig Dispense Refill  . Cholecalciferol (VITAMIN D3 PO) Take 1 tablet by mouth daily.    Marland Kitchen loratadine (CLARITIN) 10 MG tablet Take 10 mg by mouth daily.    . Multiple Vitamin (MULTI-VITAMIN DAILY) TABS Take by mouth.     No current facility-administered medications on file prior to visit.     ROS:  Review of Systems  Constitutional: Negative for fatigue, fever and unexpected weight change.  Respiratory: Negative for cough,  shortness of breath and wheezing.   Cardiovascular: Negative for chest pain, palpitations and leg swelling.  Gastrointestinal: Negative for blood in stool, constipation, diarrhea, nausea and vomiting.  Endocrine: Negative for cold intolerance, heat intolerance and polyuria.  Genitourinary: Negative for dyspareunia, dysuria, flank pain, frequency, genital sores, hematuria, menstrual problem, pelvic pain, urgency, vaginal bleeding, vaginal discharge and vaginal pain.  Musculoskeletal: Negative for back pain, joint swelling and myalgias.  Skin: Negative for rash.  Neurological: Negative for dizziness, syncope, light-headedness, numbness and headaches.  Hematological: Negative for adenopathy.  Psychiatric/Behavioral: Negative for agitation, confusion, sleep disturbance and suicidal ideas. The patient is not nervous/anxious.      Objective: BP 120/80   Ht 5\' 6"  (1.676 m)   Wt 215 lb 9.6 oz (97.8 kg)   BMI 34.80 kg/m    Physical Exam Constitutional:      Appearance: She is well-developed.  Genitourinary:     Vulva, uterus, right adnexa and left adnexa normal.     No vulval lesion or tenderness noted.     Vaginal bleeding present.     No vaginal discharge, erythema or tenderness.     No cervical motion tenderness or polyp.     Uterus is not enlarged or tender.     No right or left adnexal mass present.     Right adnexa not tender.     Left adnexa not tender.  Neck:     Musculoskeletal: Normal range of motion.     Thyroid: No thyromegaly.  Cardiovascular:     Rate and Rhythm: Normal rate and regular rhythm.     Heart sounds: Normal heart sounds. No murmur.  Pulmonary:     Effort: Pulmonary effort is normal.     Breath sounds: Normal breath sounds.  Chest:     Breasts:        Right: No mass, nipple discharge, skin change or tenderness.        Left: No mass, nipple discharge, skin change or tenderness.  Abdominal:     Palpations: Abdomen is soft.     Tenderness: There is no  abdominal tenderness. There is no guarding.  Musculoskeletal: Normal range of motion.  Neurological:     General: No focal deficit present.     Mental Status: She is alert and oriented to person, place, and time.     Cranial Nerves: No cranial nerve deficit.  Skin:    General: Skin is warm and dry.  Psychiatric:        Mood and Affect: Mood normal.  Behavior: Behavior normal.        Thought Content: Thought content normal.        Judgment: Judgment normal.  Vitals signs reviewed.     Assessment/Plan: Encounter for annual routine gynecological examination -   Screening for breast cancer - Plan: MM 3D SCREEN BREAST BILATERAL, Pt to sched mammo  Perimenopause - Plan: F/u prn DUB.  Blood tests for routine general physical examination - Plan: Comprehensive metabolic panel, Hemoglobin A1c,   Pre-diabetes - Plan: Hemoglobin A1c, Will call with results.            GYN counsel breast self exam, mammography screening, menopause, adequate intake of calcium and vitamin D, diet and exercise     F/U  Return in about 1 year (around 12/28/2019).  Tasean Mancha B. Zarinah Oviatt, PA-C 12/28/2018 11:46 AM

## 2018-12-28 ENCOUNTER — Ambulatory Visit (INDEPENDENT_AMBULATORY_CARE_PROVIDER_SITE_OTHER): Payer: BC Managed Care – PPO | Admitting: Obstetrics and Gynecology

## 2018-12-28 ENCOUNTER — Other Ambulatory Visit: Payer: Self-pay

## 2018-12-28 ENCOUNTER — Encounter: Payer: Self-pay | Admitting: Obstetrics and Gynecology

## 2018-12-28 VITALS — BP 120/80 | Ht 66.0 in | Wt 215.6 lb

## 2018-12-28 DIAGNOSIS — Z01419 Encounter for gynecological examination (general) (routine) without abnormal findings: Secondary | ICD-10-CM | POA: Diagnosis not present

## 2018-12-28 DIAGNOSIS — R7303 Prediabetes: Secondary | ICD-10-CM

## 2018-12-28 DIAGNOSIS — Z1239 Encounter for other screening for malignant neoplasm of breast: Secondary | ICD-10-CM

## 2018-12-28 DIAGNOSIS — Z0000 Encounter for general adult medical examination without abnormal findings: Secondary | ICD-10-CM

## 2018-12-28 DIAGNOSIS — N951 Menopausal and female climacteric states: Secondary | ICD-10-CM

## 2018-12-29 LAB — HEMOGLOBIN A1C
Est. average glucose Bld gHb Est-mCnc: 117 mg/dL
Hgb A1c MFr Bld: 5.7 % — ABNORMAL HIGH (ref 4.8–5.6)

## 2018-12-29 LAB — COMPREHENSIVE METABOLIC PANEL
ALT: 19 IU/L (ref 0–32)
AST: 18 IU/L (ref 0–40)
Albumin/Globulin Ratio: 2.2 (ref 1.2–2.2)
Albumin: 4.6 g/dL (ref 3.8–4.9)
Alkaline Phosphatase: 60 IU/L (ref 39–117)
BUN/Creatinine Ratio: 16 (ref 9–23)
BUN: 15 mg/dL (ref 6–24)
Bilirubin Total: 0.4 mg/dL (ref 0.0–1.2)
CO2: 25 mmol/L (ref 20–29)
Calcium: 9.9 mg/dL (ref 8.7–10.2)
Chloride: 99 mmol/L (ref 96–106)
Creatinine, Ser: 0.95 mg/dL (ref 0.57–1.00)
GFR calc Af Amer: 80 mL/min/{1.73_m2} (ref >59)
GFR calc non Af Amer: 69 mL/min/{1.73_m2} (ref >59)
Globulin, Total: 2.1 g/dL (ref 1.5–4.5)
Glucose: 89 mg/dL (ref 65–99)
Potassium: 4.4 mmol/L (ref 3.5–5.2)
Sodium: 140 mmol/L (ref 134–144)
Total Protein: 6.7 g/dL (ref 6.0–8.5)

## 2018-12-29 NOTE — Progress Notes (Signed)
Pls let pt know labs normal except stable pre-diabetes. Diet/exercise/wt loss. Rechk in 1 yr.

## 2018-12-29 NOTE — Progress Notes (Signed)
Pt aware.

## 2019-01-15 ENCOUNTER — Ambulatory Visit
Admission: RE | Admit: 2019-01-15 | Discharge: 2019-01-15 | Disposition: A | Discharge status: Home / Self Care | Payer: BC Managed Care – PPO | Source: Ambulatory Visit | Attending: Obstetrics and Gynecology | Admitting: Obstetrics and Gynecology

## 2019-01-15 ENCOUNTER — Other Ambulatory Visit: Payer: Self-pay

## 2019-01-15 DIAGNOSIS — Z1231 Encounter for screening mammogram for malignant neoplasm of breast: Secondary | ICD-10-CM | POA: Diagnosis not present

## 2019-01-15 DIAGNOSIS — Z1239 Encounter for other screening for malignant neoplasm of breast: Secondary | ICD-10-CM

## 2019-01-16 ENCOUNTER — Other Ambulatory Visit: Payer: Self-pay | Admitting: Obstetrics and Gynecology

## 2019-01-16 DIAGNOSIS — R921 Mammographic calcification found on diagnostic imaging of breast: Secondary | ICD-10-CM

## 2019-01-16 DIAGNOSIS — R928 Other abnormal and inconclusive findings on diagnostic imaging of breast: Secondary | ICD-10-CM

## 2019-01-17 ENCOUNTER — Telehealth: Payer: Self-pay

## 2019-01-17 NOTE — Telephone Encounter (Signed)
Pt calling requesting her mammo results. Aware [facility] not in office until next week. Please advise

## 2019-01-18 NOTE — Telephone Encounter (Signed)
Pt aware.

## 2019-01-18 NOTE — Telephone Encounter (Signed)
They want to do additional imaging of the left breast to see the tissue better, and this is very common. They will be calling her to schedule this. Pls notify pt. If she has any further questions, please forward to provider. Thx.

## 2019-01-31 ENCOUNTER — Ambulatory Visit
Admission: RE | Admit: 2019-01-31 | Discharge: 2019-01-31 | Disposition: A | Discharge status: Home / Self Care | Payer: BC Managed Care – PPO | Source: Ambulatory Visit | Attending: Obstetrics and Gynecology | Admitting: Obstetrics and Gynecology

## 2019-01-31 ENCOUNTER — Encounter: Payer: Self-pay | Admitting: Obstetrics and Gynecology

## 2019-01-31 ENCOUNTER — Other Ambulatory Visit: Payer: Self-pay

## 2019-01-31 DIAGNOSIS — R921 Mammographic calcification found on diagnostic imaging of breast: Secondary | ICD-10-CM | POA: Diagnosis present

## 2019-01-31 DIAGNOSIS — R928 Other abnormal and inconclusive findings on diagnostic imaging of breast: Secondary | ICD-10-CM | POA: Insufficient documentation

## 2019-07-02 ENCOUNTER — Telehealth: Payer: Self-pay

## 2019-07-02 NOTE — Telephone Encounter (Signed)
Pt calling; had elevated Bps over the weekend and ringing in her ears.  Can [facility] see her or does she need to see PCP.  [phone]  Pt was also tx'd from front desk.  Adv we are no longer a primary care; she needs to see her PCP.

## 2019-12-31 ENCOUNTER — Encounter: Payer: Self-pay | Admitting: Obstetrics and Gynecology

## 2019-12-31 ENCOUNTER — Ambulatory Visit (INDEPENDENT_AMBULATORY_CARE_PROVIDER_SITE_OTHER): Payer: BC Managed Care – PPO | Admitting: Obstetrics and Gynecology

## 2019-12-31 ENCOUNTER — Other Ambulatory Visit: Payer: Self-pay

## 2019-12-31 ENCOUNTER — Ambulatory Visit: Payer: BC Managed Care – PPO | Admitting: Obstetrics and Gynecology

## 2019-12-31 VITALS — BP 104/70 | Ht 66.0 in | Wt 212.0 lb

## 2019-12-31 DIAGNOSIS — Z131 Encounter for screening for diabetes mellitus: Secondary | ICD-10-CM

## 2019-12-31 DIAGNOSIS — Z0000 Encounter for general adult medical examination without abnormal findings: Secondary | ICD-10-CM | POA: Diagnosis not present

## 2019-12-31 DIAGNOSIS — Z01419 Encounter for gynecological examination (general) (routine) without abnormal findings: Secondary | ICD-10-CM

## 2019-12-31 DIAGNOSIS — N951 Menopausal and female climacteric states: Secondary | ICD-10-CM | POA: Diagnosis not present

## 2019-12-31 DIAGNOSIS — Z1231 Encounter for screening mammogram for malignant neoplasm of breast: Secondary | ICD-10-CM

## 2019-12-31 DIAGNOSIS — Z13220 Encounter for screening for lipoid disorders: Secondary | ICD-10-CM

## 2019-12-31 NOTE — Patient Instructions (Signed)
I value your feedback and entrusting us with your care. If you get a [facility] patient survey, I would appreciate you taking the time to let us know about your experience today. Thank you! ° °As of May 24, 2019, your lab results will be released to your MyChart immediately, before I even have a chance to see them. Please give me time to review them and contact you if there are any abnormalities. Thank you for your patience.  ° °[facility]: [phone] ° ° ° °

## 2019-12-31 NOTE — Progress Notes (Signed)
Chief Complaint  Patient presents with  . Gynecologic Exam    HPI:      Ms. Stacey Case is a 54 y.o. G0P0000 who LMP was No LMP recorded. (Menstrual status: Perimenopausal)., presents today for her annual examination. Her menses are absent since 6/21. No BTB. Hx of endocx polyp with polypectomy 2016 with Dr. Glennon Mac. She does have tolerable, and sometimes terrible, vasomotor sx. Interested in non-hormonal tx and had estroven recommended to her.  Sex activity: single partner, contraception - none. Hx of infertility. She does not have vaginal dryness.  Last Pap: 12/26/17  Results were: no abnormalities /neg HPV DNA.  Hx of STDs: none  Last mammogram: 01/31/19 Results were: normal--routine follow-up in 12 months. 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 There is no FH of breast cancer. There is no FH of ovarian cancer. The patient does do self-breast exams.  Colonoscopy: colonoscopy 2021 without abnormalities. FH of polyps so has colonoscopy Q5 yrs.  Tobacco use: The patient denies current or previous tobacco use. Alcohol use: none No drug use Exercise: moderately active  She does get adequate calcium and Vitamin D in her diet. WNL lipids 2019 but mild pre-DM 2019 and 2020. Due for lab rechk.    Past Medical History:  Diagnosis Date  . Abnormal uterine bleeding (AUB)   . Breast mass in female 2000   LEFT  . FH: infertility   . History of mammogram 4/22/14L 11/12/15   BIRAD 1; NEG  . History of Papanicolaou smear of cervix 09/20/11; 10/29/14   -/-; -/-  . Lichen sclerosus    VULVA  . Menorrhagia     Past Surgical History:  Procedure Laterality Date  . BREAST BIOPSY Left 11/19/2014   bx/clip- neg  . COLONOSCOPY  07/11/2014   POLYPS - HYPERPLASIA; DR. Aleen Sells  . COMBINED HYSTEROSCOPY DIAGNOSTIC / D&C  03/26/2014   SDJ  . ENDOCERVICAL POLYPECTOMY  03/26/2014   SDJ    Family History  Problem Relation Age of Onset  . Diabetes Maternal Grandfather        TYPE 2  . Breast cancer Neg  Hx     Social History   Socioeconomic History  . Marital status: Married    Spouse name: Not on file  . Number of children: 1  . Years of education: 65  . Highest education level: Not on file  Occupational History  . Occupation: Pharmacist, hospital  Tobacco Use  . Smoking status: Never Smoker  . Smokeless tobacco: Never Used  Vaping Use  . Vaping Use: Never used  Substance and Sexual Activity  . Alcohol use: No  . Drug use: No  . Sexual activity: Yes    Birth control/protection: None  Other Topics Concern  . Not on file  Social History Narrative  . Not on file   Social Determinants of Health   Financial Resource Strain:   . Difficulty of Paying Living Expenses:   Food Insecurity:   . Worried About Charity fundraiser in the Last Year:   . Arboriculturist in the Last Year:   Transportation Needs:   . Film/video editor (Medical):   Marland Kitchen Lack of Transportation (Non-Medical):   Physical Activity:   . Days of Exercise per Week:   . Minutes of Exercise per Session:   Stress:   . Feeling of Stress :   Social Connections:   . Frequency of Communication with Friends and Family:   . Frequency of Social Gatherings with Friends  and Family:   . Attends Religious Services:   . Active Member of Clubs or Organizations:   . Attends Archivist Meetings:   Marland Kitchen Marital Status:   Intimate Partner Violence:   . Fear of Current or Ex-Partner:   . Emotionally Abused:   Marland Kitchen Physically Abused:   . Sexually Abused:     Current Outpatient Medications on File Prior to Visit  Medication Sig Dispense Refill  . Cholecalciferol (VITAMIN D3 PO) Take 1 tablet by mouth daily.    Marland Kitchen loratadine (CLARITIN) 10 MG tablet Take 10 mg by mouth daily.    . Multiple Vitamin (MULTI-VITAMIN DAILY) TABS Take by mouth.     No current facility-administered medications on file prior to visit.    ROS:  Review of Systems  Constitutional: Negative for fatigue, fever and unexpected weight change.    Respiratory: Negative for cough, shortness of breath and wheezing.   Cardiovascular: Negative for chest pain, palpitations and leg swelling.  Gastrointestinal: Negative for blood in stool, constipation, diarrhea, nausea and vomiting.  Endocrine: Negative for cold intolerance, heat intolerance and polyuria.  Genitourinary: Negative for dyspareunia, dysuria, flank pain, frequency, genital sores, hematuria, menstrual problem, pelvic pain, urgency, vaginal bleeding, vaginal discharge and vaginal pain.  Musculoskeletal: Negative for back pain, joint swelling and myalgias.  Skin: Negative for rash.  Neurological: Negative for dizziness, syncope, light-headedness, numbness and headaches.  Hematological: Negative for adenopathy.  Psychiatric/Behavioral: Negative for agitation, confusion, sleep disturbance and suicidal ideas. The patient is not nervous/anxious.      Objective: BP 104/70   Ht 5\' 6"  (1.676 m)   Wt 212 lb (96.2 kg)   BMI 34.22 kg/m    Physical Exam Constitutional:      Appearance: She is well-developed.  Genitourinary:     Vulva, vagina, cervix, uterus, right adnexa and left adnexa normal.     No vulval lesion or tenderness noted.     No vaginal discharge, erythema or tenderness.     No cervical polyp.     Uterus is not enlarged or tender.     No right or left adnexal mass present.     Right adnexa not tender.     Left adnexa not tender.  Neck:     Thyroid: No thyromegaly.  Cardiovascular:     Rate and Rhythm: Normal rate and regular rhythm.     Heart sounds: Normal heart sounds. No murmur heard.   Pulmonary:     Effort: Pulmonary effort is normal.     Breath sounds: Normal breath sounds.  Chest:     Breasts:        Right: No mass, nipple discharge, skin change or tenderness.        Left: No mass, nipple discharge, skin change or tenderness.  Abdominal:     Palpations: Abdomen is soft.     Tenderness: There is no abdominal tenderness. There is no guarding.   Musculoskeletal:        General: Normal range of motion.     Cervical back: Normal range of motion.  Neurological:     General: No focal deficit present.     Mental Status: She is alert and oriented to person, place, and time.     Cranial Nerves: No cranial nerve deficit.  Skin:    General: Skin is warm and dry.  Psychiatric:        Mood and Affect: Mood normal.        Behavior: Behavior normal.  Thought Content: Thought content normal.        Judgment: Judgment normal.  Vitals reviewed.     Assessment/Plan: Encounter for annual routine gynecological examination  Encounter for screening mammogram for malignant neoplasm of breast - Plan: MM 3D SCREEN BREAST BILATERAL; pt to sched mammo  Vasomotor symptoms due to menopause--try estroven. F/u prn.   Blood tests for routine general physical examination - Plan: Comprehensive metabolic panel, Hemoglobin A1c, Lipid panel, Lipid panel, Hemoglobin A1c, Comprehensive metabolic panel,   Screening cholesterol level - Plan: Lipid panel, Lipid panel,   Screening for diabetes mellitus - Plan: Hemoglobin A1c, Hemoglobin A1c,             GYN counsel breast self exam, mammography screening, menopause, adequate intake of calcium and vitamin D, diet and exercise     F/U  Return in about 1 year (around 12/30/2020).  Ithiel Liebler B. Jaycob Mcclenton, PA-C 12/31/2019 4:28 PM

## 2020-01-03 LAB — COMPREHENSIVE METABOLIC PANEL
ALT: 20 IU/L (ref 0–32)
AST: 18 IU/L (ref 0–40)
Albumin/Globulin Ratio: 1.8 (ref 1.2–2.2)
Albumin: 4.4 g/dL (ref 3.8–4.9)
Alkaline Phosphatase: 83 IU/L (ref 48–121)
BUN/Creatinine Ratio: 20 (ref 9–23)
BUN: 18 mg/dL (ref 6–24)
Bilirubin Total: 0.5 mg/dL (ref 0.0–1.2)
CO2: 23 mmol/L (ref 20–29)
Calcium: 9.8 mg/dL (ref 8.7–10.2)
Chloride: 103 mmol/L (ref 96–106)
Creatinine, Ser: 0.92 mg/dL (ref 0.57–1.00)
GFR calc Af Amer: 82 mL/min/{1.73_m2} (ref >59)
GFR calc non Af Amer: 71 mL/min/{1.73_m2} (ref >59)
Globulin, Total: 2.5 g/dL (ref 1.5–4.5)
Glucose: 90 mg/dL (ref 65–99)
Potassium: 4.4 mmol/L (ref 3.5–5.2)
Sodium: 141 mmol/L (ref 134–144)
Total Protein: 6.9 g/dL (ref 6.0–8.5)

## 2020-01-03 LAB — HEMOGLOBIN A1C
Est. average glucose Bld gHb Est-mCnc: 120 mg/dL
Hgb A1c MFr Bld: 5.8 % — ABNORMAL HIGH (ref 4.8–5.6)

## 2020-01-04 LAB — LIPID PANEL
Chol/HDL Ratio: 2.9 ratio (ref 0.0–4.4)
Cholesterol, Total: 186 mg/dL (ref 100–199)
HDL: 65 mg/dL (ref >39)
LDL Chol Calc (NIH): 102 mg/dL — ABNORMAL HIGH (ref 0–99)
Triglycerides: 110 mg/dL (ref 0–149)
VLDL Cholesterol Cal: 19 mg/dL (ref 5–40)

## 2020-01-10 ENCOUNTER — Telehealth: Payer: Self-pay

## 2020-01-10 NOTE — Telephone Encounter (Signed)
Pt calling for lab results. Please advise. 

## 2020-01-11 NOTE — Telephone Encounter (Signed)
Cannot get in her mychart. Aware of results.

## 2020-01-11 NOTE — Telephone Encounter (Signed)
I sent a note through MyChart re: her results. Pls check with pt to see that she got them. Thx.

## 2020-02-04 ENCOUNTER — Ambulatory Visit
Admission: RE | Admit: 2020-02-04 | Discharge: 2020-02-04 | Disposition: A | Discharge status: Home / Self Care | Payer: BC Managed Care – PPO | Source: Ambulatory Visit | Attending: Obstetrics and Gynecology | Admitting: Obstetrics and Gynecology

## 2020-02-04 ENCOUNTER — Other Ambulatory Visit: Payer: Self-pay

## 2020-02-04 DIAGNOSIS — Z1231 Encounter for screening mammogram for malignant neoplasm of breast: Secondary | ICD-10-CM | POA: Insufficient documentation

## 2020-02-05 ENCOUNTER — Encounter: Payer: Self-pay | Admitting: Obstetrics and Gynecology

## 2020-05-12 ENCOUNTER — Telehealth: Payer: Self-pay

## 2020-05-12 NOTE — Telephone Encounter (Signed)
Patient reports she had some brown/red d/c this morning after not having a period for 1.5 years. She is concerned. She is also inquiring about getting a rx for anxiety/being very anxious. DH#[phone]

## 2020-05-12 NOTE — Telephone Encounter (Signed)
Pls call pt to sched Gyn u/s for PMB, appt with me after for results and anxiety. Thx

## 2020-05-13 NOTE — Telephone Encounter (Signed)
Called and left voicemail for patient to call back to be scheduled. 

## 2020-05-14 ENCOUNTER — Other Ambulatory Visit (HOSPITAL_COMMUNITY)
Admission: RE | Admit: 2020-05-14 | Discharge: 2020-05-14 | Disposition: A | Discharge status: Home / Self Care | Payer: BC Managed Care – PPO | Source: Ambulatory Visit | Attending: Obstetrics and Gynecology | Admitting: Obstetrics and Gynecology

## 2020-05-14 ENCOUNTER — Other Ambulatory Visit: Payer: Self-pay

## 2020-05-14 ENCOUNTER — Other Ambulatory Visit: Payer: Self-pay | Admitting: Obstetrics and Gynecology

## 2020-05-14 ENCOUNTER — Encounter: Payer: Self-pay | Admitting: Obstetrics and Gynecology

## 2020-05-14 ENCOUNTER — Ambulatory Visit (INDEPENDENT_AMBULATORY_CARE_PROVIDER_SITE_OTHER): Payer: BC Managed Care – PPO

## 2020-05-14 ENCOUNTER — Ambulatory Visit (INDEPENDENT_AMBULATORY_CARE_PROVIDER_SITE_OTHER): Payer: BC Managed Care – PPO | Admitting: Obstetrics and Gynecology

## 2020-05-14 VITALS — BP 124/80 | Ht 67.0 in | Wt 214.0 lb

## 2020-05-14 DIAGNOSIS — Z124 Encounter for screening for malignant neoplasm of cervix: Secondary | ICD-10-CM | POA: Diagnosis present

## 2020-05-14 DIAGNOSIS — N950 Postmenopausal bleeding: Secondary | ICD-10-CM | POA: Insufficient documentation

## 2020-05-14 DIAGNOSIS — F419 Anxiety disorder, unspecified: Secondary | ICD-10-CM

## 2020-05-14 MED ORDER — LORAZEPAM 0.5 MG PO TABS: 0.5000 mg | ORAL_TABLET | Freq: Two times a day (BID) | ORAL | 0 refills | Status: DC | PRN

## 2020-05-14 NOTE — Patient Instructions (Signed)
I value your feedback and entrusting us with your care. If you get a [facility] patient survey, I would appreciate you taking the time to let us know about your experience today. Thank you!  As of May 24, 2019, your lab results will be released to your MyChart immediately, before I even have a chance to see them. Please give me time to review them and contact you if there are any abnormalities. Thank you for your patience.  

## 2020-05-14 NOTE — Progress Notes (Signed)
Stacey Arthurs, Stacey Case   Chief Complaint  Patient presents with  . Follow-up    u/s    HPI:      Stacey Case is a 54 y.o. G0P0000 whose LMP was Patient's last menstrual period was 12/16/2017., presents today for PMB the past 3 days. Having a dark d/c, minimal cramping. Had covid Booster 2 days before sx started. Stacey Case menses have been absent since 6/21. Hx of endocx polyp with polypectomy 2016 with Dr. Glennon Mac. Last Pap:7/15/19Results were: no abnormalities/neg HPV DNA. Last annual 7/21  Also with recent anxiety. 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 Feels overwhelmed. Having worry, difficulty falling asleep and staying asleep. Did Rx for 9 months yrs ago when under lots of stress (doesn't remember name). No other anxiety/depression meds. Is exercising, which sometimes helps.    Past Medical History:  Diagnosis Date  . Abnormal uterine bleeding (AUB)   . Breast mass in female 2000   LEFT  . FH: infertility   . History of mammogram 4/22/14L 11/12/15   BIRAD 1; NEG  . History of Papanicolaou smear of cervix 09/20/11; 10/29/14   -/-; -/-  . Lichen sclerosus    VULVA  . Menorrhagia     Past Surgical History:  Procedure Laterality Date  . BREAST BIOPSY Left 11/19/2014   bx/clip- neg  . COLONOSCOPY  07/11/2014   POLYPS - HYPERPLASIA; DR. Aleen Sells  . COMBINED HYSTEROSCOPY DIAGNOSTIC / D&C  03/26/2014   SDJ  . ENDOCERVICAL POLYPECTOMY  03/26/2014   SDJ    Family History  Problem Relation Age of Onset  . Diabetes Maternal Grandfather        TYPE 2  . Breast cancer Neg Hx     Social History   Socioeconomic History  . Marital status: Married    Spouse name: Not on file  . Number of children: 1  . Years of education: 37  . Highest education level: Not on file  Occupational History  . Occupation: Pharmacist, hospital  Tobacco Use  . Smoking status: Never Smoker  . Smokeless tobacco: Never Used  Vaping Use  .  Vaping Use: Never used  Substance and Sexual Activity  . Alcohol use: No  . Drug use: No  . Sexual activity: Yes    Birth control/protection: None  Other Topics Concern  . Not on file  Social History Narrative  . Not on file   Social Determinants of Health   Financial Resource Strain:   . Difficulty of Paying Living Expenses: Not on file  Food Insecurity:   . Worried About Charity fundraiser in the Last Year: Not on file  . Ran Out of Food in the Last Year: Not on file  Transportation Needs:   . Lack of Transportation (Medical): Not on file  . Lack of Transportation (Non-Medical): Not on file  Physical Activity:   . Days of Exercise per Week: Not on file  . Minutes of Exercise per Session: Not on file  Stress:   . Feeling of Stress : Not on file  Social Connections:   . Frequency of Communication with Friends and Family: Not on file  . Frequency of Social Gatherings with Friends and Family: Not on file  . Attends Religious Services: Not on file  . Active Member of Clubs or Organizations: Not on file  . Attends Archivist Meetings: Not on file  . Marital Status: Not on file  Intimate  Partner Violence:   . Fear of Current or Ex-Partner: Not on file  . Emotionally Abused: Not on file  . Physically Abused: Not on file  . Sexually Abused: Not on file    Outpatient Medications Prior to Visit  Medication Sig Dispense Refill  . Cholecalciferol (VITAMIN D3 PO) Take 1 tablet by mouth daily.    Marland Kitchen loratadine (CLARITIN) 10 MG tablet Take 10 mg by mouth daily.    . Multiple Vitamin (MULTI-VITAMIN DAILY) TABS Take by mouth.     No facility-administered medications prior to visit.      ROS:  Review of Systems  Constitutional: Negative for fever.  Gastrointestinal: Negative for blood in stool, constipation, diarrhea, nausea and vomiting.  Genitourinary: Positive for vaginal bleeding. Negative for dyspareunia, dysuria, flank pain, frequency, hematuria, urgency,  vaginal discharge and vaginal pain.  Musculoskeletal: Negative for back pain.  Skin: Negative for rash.  Psychiatric/Behavioral: Positive for agitation, dysphoric mood and sleep disturbance.    OBJECTIVE:   Vitals:  BP 124/80   Ht 5\' 7"  (1.702 m)   Wt 214 lb (97.1 kg)   LMP 12/16/2017   BMI 33.52 kg/m   Physical Exam Vitals reviewed.  Constitutional:      Appearance: She is well-developed.  Pulmonary:     Effort: Pulmonary effort is normal.  Genitourinary:    General: Normal vulva.     Pubic Area: No rash.      Labia:        Right: No rash, tenderness or lesion.        Left: No rash, tenderness or lesion.      Vagina: Bleeding present. No vaginal discharge, erythema or tenderness.     Cervix: Normal.     Uterus: Normal. Not enlarged and not tender.      Adnexa: Right adnexa normal and left adnexa normal.       Right: No mass or tenderness.         Left: No mass or tenderness.       Comments: BROWN D/C AT CX OS Musculoskeletal:        General: Normal range of motion.     Cervical back: Normal range of motion.  Skin:    General: Skin is warm and dry.  Neurological:     General: No focal deficit present.     Mental Status: She is alert and oriented to person, place, and time.  Psychiatric:        Mood and Affect: Mood normal.        Behavior: Behavior normal.        Thought Content: Thought content normal.        Judgment: Judgment normal.   RESULTS:  ULTRASOUND REPORT  Location: [facility]  Date of Service: 05/14/2020    Indications:Postmenopausal Bleedding   Findings:  The uterus is anteverted and measures 6.2 x 3.3 x 2.2 cm. Echo texture is homogenous without evidence of focal masses. The Endometrium measures 1.5 mm.  Right Ovary measures 1.7 x 1.0 x 1.0 cm. It is normal in appearance. Left Ovary measures 1.7 x 1.2 x 1.0 cm. It is normal in appearance. Survey of the adnexa demonstrates no adnexal masses. There is no free fluid in the cul de  sac.  There is some complex fluid in the mid cervical canal to the outer cervical os measuring 17.5 x 10.7 x 13.2 mm.   Impression: 1. There is some complex fluid within the cervical canal.  2. Thin endmetrium.  3. Normal appearing ovaries.  Recommendations: 1.Clinical correlation with the patient's History and Physical Exam.   Gweneth Dimitri, RT  GAD 7 : Generalized Anxiety Score 05/14/2020  Nervous, Anxious, on Edge 2  Control/stop worrying 2  Worry too much - different things 2  Trouble relaxing 2  Restless 1  Easily annoyed or irritable 1  Afraid - awful might happen 0  Total GAD 7 Score 10  Anxiety Difficulty Somewhat difficult     Depression screen PHQ 2/9 05/14/2020  Decreased Interest 0  Down, Depressed, Hopeless 1  PHQ - 2 Score 1  Altered sleeping 2  Tired, decreased energy 1  Change in appetite 0  Feeling bad or failure about yourself  0  Trouble concentrating 1  Moving slowly or fidgety/restless 0  Suicidal thoughts 0  PHQ-9 Score 5  Difficult doing work/chores Somewhat difficult    Assessment/Plan: PMB (postmenopausal bleeding) - Plan: Cytology - PAP; For a few days; Brown d/c on exam. Neg GYN u/s; EM=1.5 mm. Check pap. If neg, follow sx. Discussed etiology of atrophy. If sx resolve, will follow expectantly. Question related to covid booster (more data will be available in the yrs to come). F/u prn.   Cervical cancer screening - Plan: Cytology - PAP  Anxiety - Plan: LORazepam (ATIVAN) 0.5 MG tablet; discussed SSRIs, prn meds, sleep meds, therapist, exercise. Pt would like to try sleep meds and prn med. Rx ativan to take sparingly. If uses frequently will change to SSRI. Try melatonin or unisom for sleep. Cont exercise. If sx persist, will try trazodone. F/u prn    Meds ordered this encounter  Medications  . LORazepam (ATIVAN) 0.5 MG tablet    Sig: Take 1 tablet (0.5 mg total) by mouth 2 (two) times daily as needed for anxiety.    Dispense:  30  tablet    Refill:  0    Order Specific Question:   Supervising Provider    Answer:   Gae Dry [[id number]]      Return if symptoms worsen or fail to improve.  Annica Marinello B. Jeric Slagel, PA-C 05/14/2020 10:48 AM

## 2020-05-19 LAB — CYTOLOGY - PAP
Adequacy: ABSENT
Diagnosis: NEGATIVE

## 2020-11-05 ENCOUNTER — Telehealth: Payer: Self-pay | Admitting: Obstetrics and Gynecology

## 2020-11-05 NOTE — Telephone Encounter (Signed)
I can order her labs but her insurance now wants PCPs to manage the labs. They sometimes don't cover the cost as well if we order them because they consider [place] "specialists". Does she have a PCP who can order them for her?

## 2020-11-05 NOTE — Telephone Encounter (Signed)
Patient is coming in on 07/272022 for her annual. Was wanting to get labs done before her appt. Could she get those done before her appt would like to have done fasitng.   CB# [phone]

## 2020-11-06 NOTE — Telephone Encounter (Signed)
Pt aware. She does have a PCP.

## 2020-12-31 ENCOUNTER — Ambulatory Visit: Payer: BC Managed Care – PPO | Admitting: Obstetrics and Gynecology

## 2021-01-07 ENCOUNTER — Ambulatory Visit (INDEPENDENT_AMBULATORY_CARE_PROVIDER_SITE_OTHER): Payer: BC Managed Care – PPO | Admitting: Obstetrics and Gynecology

## 2021-01-07 ENCOUNTER — Encounter: Payer: Self-pay | Admitting: Obstetrics and Gynecology

## 2021-01-07 ENCOUNTER — Other Ambulatory Visit: Payer: Self-pay

## 2021-01-07 VITALS — BP 124/70 | Ht 66.0 in | Wt 213.0 lb

## 2021-01-07 DIAGNOSIS — Z131 Encounter for screening for diabetes mellitus: Secondary | ICD-10-CM

## 2021-01-07 DIAGNOSIS — R7303 Prediabetes: Secondary | ICD-10-CM | POA: Diagnosis not present

## 2021-01-07 DIAGNOSIS — F419 Anxiety disorder, unspecified: Secondary | ICD-10-CM

## 2021-01-07 DIAGNOSIS — Z01419 Encounter for gynecological examination (general) (routine) without abnormal findings: Secondary | ICD-10-CM

## 2021-01-07 DIAGNOSIS — Z1231 Encounter for screening mammogram for malignant neoplasm of breast: Secondary | ICD-10-CM

## 2021-01-07 NOTE — Patient Instructions (Signed)
I value your feedback and you entrusting us with your care. If you get a [facility] patient survey, I would appreciate you taking the time to let us know about your experience today. Thank you!  [facility]: [phone]      

## 2021-01-07 NOTE — Progress Notes (Signed)
Chief Complaint  Patient presents with   Gynecologic Exam    No concerns    HPI:      Ms. Stacey Case is a 55 y.o. G0P0000 who LMP was Patient's last menstrual period was 12/16/2017., presents today for her annual examination. Her menses were absent since 6/21. Had 1 episode light spotting/PMB after covid shot 1121, EM=1.7 mm on GYN u/s. No sx since.  Hx of endocx polyp with polypectomy 2016 with Dr. Glennon Mac. She does have tolerable vasomotor sx.    Sex activity: single partner, contraception - none. Hx of infertility. She does have vaginal dryness, has not tried lubricants.   Last Pap: 05/14/20 (done early due to PMB);  Results were: no abnormalities /neg HPV DNA 2019 Hx of STDs: none   Last mammogram: 02/04/20  Results were: normal--routine follow-up in 12 months. 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 There is no FH of breast cancer. There is no FH of ovarian cancer. The patient does self-breast exams.   Colonoscopy: colonoscopy 2021 without abnormalities. FH of polyps so has colonoscopy Q5 yrs.   Tobacco use: The patient denies current or previous tobacco use. Alcohol use: none No drug use Exercise: moderately active   Had anxiety and trouble sleeping last yr, triggered by increased job stress. Wanted prn med and given Rx lorazepam. Only took twice, no side effects. Ok so far, doing better.   She does get adequate calcium and Vitamin D in her diet. WNL lipids 2019/2021 but mild pre-DM 2019-2021. Due for lab rechk.   Past Medical History:  Diagnosis Date   Abnormal uterine bleeding (AUB)    Breast mass in female 52   LEFT   FH: infertility    History of mammogram 4/22/14L 11/12/15   BIRAD 1; NEG   History of Papanicolaou smear of cervix 09/20/11; [id number]   -/-; -/-   Lichen sclerosus    VULVA   Menorrhagia     Past Surgical History:  Procedure Laterality Date   BREAST BIOPSY Left 11/19/2014   bx/clip- neg   COLONOSCOPY  07/11/2014   POLYPS - HYPERPLASIA; DR. Aleen Sells   COMBINED  HYSTEROSCOPY DIAGNOSTIC / D&C  03/26/2014   SDJ   ENDOCERVICAL POLYPECTOMY  03/26/2014   SDJ    Family History  Problem Relation Age of Onset   Diabetes Maternal Grandfather        TYPE 2   Breast cancer Neg Hx     Social History   Socioeconomic History   Marital status: Married    Spouse name: Not on file   Number of children: 1   Years of education: 16   Highest education level: Not on file  Occupational History   Occupation: Pharmacist, hospital  Tobacco Use   Smoking status: Never   Smokeless tobacco: Never  Vaping Use   Vaping Use: Never used  Substance and Sexual Activity   Alcohol use: No   Drug use: No   Sexual activity: Yes    Birth control/protection: None  Other Topics Concern   Not on file  Social History Narrative   Not on file   Social Determinants of Health   Financial Resource Strain: Not on file  Food Insecurity: Not on file  Transportation Needs: Not on file  Physical Activity: Not on file  Stress: Not on file  Social Connections: Not on file  Intimate Partner Violence: Not on file    Current Outpatient Medications on File Prior to Visit  Medication Sig Dispense Refill  Cholecalciferol (VITAMIN D3 PO) Take 1 tablet by mouth daily.     loratadine (CLARITIN) 10 MG tablet Take 10 mg by mouth daily.     LORazepam (ATIVAN) 0.5 MG tablet Take 1 tablet (0.5 mg total) by mouth 2 (two) times daily as needed for anxiety. 30 tablet 0   Multiple Vitamin (MULTI-VITAMIN DAILY) TABS Take by mouth.     No current facility-administered medications on file prior to visit.    ROS:  Review of Systems  Constitutional:  Negative for fatigue, fever and unexpected weight change.  Respiratory:  Negative for cough, shortness of breath and wheezing.   Cardiovascular:  Negative for chest pain, palpitations and leg swelling.  Gastrointestinal:  Negative for blood in stool, constipation, diarrhea, nausea and vomiting.  Endocrine: Negative for cold intolerance, heat  intolerance and polyuria.  Genitourinary:  Negative for dyspareunia, dysuria, flank pain, frequency, genital sores, hematuria, menstrual problem, pelvic pain, urgency, vaginal bleeding, vaginal discharge and vaginal pain.  Musculoskeletal:  Negative for back pain, joint swelling and myalgias.  Skin:  Negative for rash.  Neurological:  Negative for dizziness, syncope, light-headedness, numbness and headaches.  Hematological:  Negative for adenopathy.  Psychiatric/Behavioral:  Negative for agitation, confusion, sleep disturbance and suicidal ideas. The patient is not nervous/anxious.     Objective: BP 124/70   Ht '5\' 6"'$  (1.676 m)   Wt 213 lb (96.6 kg)   LMP 12/16/2017   BMI 34.38 kg/m    Physical Exam Constitutional:      Appearance: She is well-developed.  Genitourinary:     Vulva normal.     Right Labia: No rash, tenderness or lesions.    Left Labia: No tenderness, lesions or rash.    No vaginal discharge, erythema or tenderness.      Right Adnexa: not tender and no mass present.    Left Adnexa: not tender and no mass present.    No cervical friability or polyp.     Uterus is not enlarged or tender.  Breasts:    Right: No mass, nipple discharge, skin change or tenderness.     Left: No mass, nipple discharge, skin change or tenderness.  Neck:     Thyroid: No thyromegaly.  Cardiovascular:     Rate and Rhythm: Normal rate and regular rhythm.     Heart sounds: Normal heart sounds. No murmur heard. Pulmonary:     Effort: Pulmonary effort is normal.     Breath sounds: Normal breath sounds.  Abdominal:     Palpations: Abdomen is soft.     Tenderness: There is no abdominal tenderness. There is no guarding or rebound.  Musculoskeletal:        General: Normal range of motion.     Cervical back: Normal range of motion.  Lymphadenopathy:     Cervical: No cervical adenopathy.  Neurological:     General: No focal deficit present.     Mental Status: She is alert and oriented to  person, place, and time.     Cranial Nerves: No cranial nerve deficit.  Skin:    General: Skin is warm and dry.  Psychiatric:        Mood and Affect: Mood normal.        Behavior: Behavior normal.        Thought Content: Thought content normal.        Judgment: Judgment normal.  Vitals reviewed.    Assessment/Plan: Encounter for annual routine gynecological examination  Encounter for screening mammogram for malignant neoplasm  of breast - Plan: MM 3D SCREEN BREAST BILATERAL; pt to sched mammo  Anxiety--doing well. Has Rx lorazepam prn.   Pre-diabetes - Plan: Hemoglobin A1c  Screening for diabetes mellitus - Plan: Hemoglobin A1c           GYN counsel breast self exam, mammography screening, menopause, adequate intake of calcium and vitamin D, diet and exercise     F/U  Return in about 1 year (around 01/07/2022).  Nosson Wender B. Terena Bohan, PA-C 01/07/2021 2:33 PM

## 2021-01-08 LAB — HEMOGLOBIN A1C
Est. average glucose Bld gHb Est-mCnc: 126 mg/dL
Hgb A1c MFr Bld: 6 % — ABNORMAL HIGH (ref 4.8–5.6)

## 2021-02-04 ENCOUNTER — Ambulatory Visit: Payer: BC Managed Care – PPO

## 2021-02-19 ENCOUNTER — Ambulatory Visit
Admission: RE | Admit: 2021-02-19 | Discharge: 2021-02-19 | Disposition: A | Discharge status: Home / Self Care | Payer: BC Managed Care – PPO | Source: Ambulatory Visit | Attending: Obstetrics and Gynecology | Admitting: Obstetrics and Gynecology

## 2021-02-19 ENCOUNTER — Other Ambulatory Visit: Payer: Self-pay

## 2021-02-19 DIAGNOSIS — Z1231 Encounter for screening mammogram for malignant neoplasm of breast: Secondary | ICD-10-CM | POA: Insufficient documentation

## 2022-02-04 ENCOUNTER — Ambulatory Visit (INDEPENDENT_AMBULATORY_CARE_PROVIDER_SITE_OTHER): Payer: BC Managed Care – PPO | Admitting: Obstetrics and Gynecology

## 2022-02-04 ENCOUNTER — Encounter: Payer: Self-pay | Admitting: Obstetrics and Gynecology

## 2022-02-04 VITALS — BP 122/72 | Ht 66.0 in | Wt 215.0 lb

## 2022-02-04 DIAGNOSIS — F419 Anxiety disorder, unspecified: Secondary | ICD-10-CM | POA: Diagnosis not present

## 2022-02-04 DIAGNOSIS — Z01419 Encounter for gynecological examination (general) (routine) without abnormal findings: Secondary | ICD-10-CM

## 2022-02-04 DIAGNOSIS — Z1231 Encounter for screening mammogram for malignant neoplasm of breast: Secondary | ICD-10-CM | POA: Diagnosis not present

## 2022-02-04 DIAGNOSIS — R7303 Prediabetes: Secondary | ICD-10-CM

## 2022-02-04 NOTE — Progress Notes (Signed)
Chief Complaint  Patient presents with   Annual Exam    Patient presents in office today for annual physical and states she feels well today.     HPI:      Ms. KADRA KOHAN is a 56 y.o. G0P0000 who LMP was Patient's last menstrual period was 12/16/2017., presents today for her annual examination. Her menses have been absent for over a year. They were absent since 6/21 but had 1 episode light spotting/PMB after covid shot 11/21, EM=1.7 mm on GYN u/s.  Hx of endocx polyp with polypectomy 2016 with Dr. Glennon Mac. She does have tolerable vasomotor sx.    Sex activity: single partner, contraception - none. Hx of infertility. She does have vaginal dryness, has not tried lubricants.   Last Pap: 05/14/20 (done early due to PMB);  Results were: no abnormalities /neg HPV DNA 2019 Hx of STDs: none   Last mammogram: 02/19/21  Results were: normal--routine follow-up in 12 months. 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 There is no FH of breast cancer. There is no FH of ovarian cancer. The patient does self-breast exams.   Colonoscopy: colonoscopy 2021 without abnormalities. FH of polyps so has colonoscopy Q5 yrs.   Tobacco use: The patient denies current or previous tobacco use. Alcohol use: none No drug use Exercise: moderately active   Had anxiety and trouble sleeping in past, triggered by increased job stress. Wanted prn med and given Rx lorazepam. Only took twice, no side effects. Now does melatonin or doxylamine with sx control.   She does get adequate calcium and Vitamin D in her diet. WNL lipids 2019/2021 but mild pre-DM 2019-2022. Due for lab rechk with PCP for insurance coverage.   Past Medical History:  Diagnosis Date   Abnormal uterine bleeding (AUB)    Breast mass in female 80   LEFT   FH: infertility    History of mammogram 4/22/14L 11/12/15   BIRAD 1; NEG   History of Papanicolaou smear of cervix 09/20/11; 0/34/91   -/-; -/-   Lichen sclerosus    VULVA   Menorrhagia     Past Surgical  History:  Procedure Laterality Date   BREAST BIOPSY Left 11/19/2014   bx/clip- neg   COLONOSCOPY  07/11/2014   POLYPS - HYPERPLASIA; DR. Aleen Sells   COMBINED HYSTEROSCOPY DIAGNOSTIC / D&C  03/26/2014   SDJ   ENDOCERVICAL POLYPECTOMY  03/26/2014   SDJ    Family History  Problem Relation Age of Onset   Diabetes Maternal Grandfather        TYPE 2   Breast cancer Neg Hx     Social History   Socioeconomic History   Marital status: Married    Spouse name: Not on file   Number of children: 1   Years of education: 16   Highest education level: Not on file  Occupational History   Occupation: Pharmacist, hospital  Tobacco Use   Smoking status: Never   Smokeless tobacco: Never  Vaping Use   Vaping Use: Never used  Substance and Sexual Activity   Alcohol use: No   Drug use: No   Sexual activity: Yes    Birth control/protection: None  Other Topics Concern   Not on file  Social History Narrative   Not on file   Social Determinants of Health   Financial Resource Strain: Not on file  Food Insecurity: Not on file  Transportation Needs: Not on file  Physical Activity: Not on file  Stress: Not on file  Social Connections: Not  on file  Intimate Partner Violence: Not on file    Current Outpatient Medications on File Prior to Visit  Medication Sig Dispense Refill   Cholecalciferol (VITAMIN D3 PO) Take 1 tablet by mouth daily.     loratadine (CLARITIN) 10 MG tablet Take 10 mg by mouth daily.     LORazepam (ATIVAN) 0.5 MG tablet Take 1 tablet (0.5 mg total) by mouth 2 (two) times daily as needed for anxiety. 30 tablet 0   Multiple Vitamin (MULTI-VITAMIN DAILY) TABS Take by mouth.     No current facility-administered medications on file prior to visit.    ROS:  Review of Systems  Constitutional:  Negative for fatigue, fever and unexpected weight change.  Respiratory:  Negative for cough, shortness of breath and wheezing.   Cardiovascular:  Negative for chest pain, palpitations and leg  swelling.  Gastrointestinal:  Negative for blood in stool, constipation, diarrhea, nausea and vomiting.  Endocrine: Negative for cold intolerance, heat intolerance and polyuria.  Genitourinary:  Negative for dyspareunia, dysuria, flank pain, frequency, genital sores, hematuria, menstrual problem, pelvic pain, urgency, vaginal bleeding, vaginal discharge and vaginal pain.  Musculoskeletal:  Negative for back pain, joint swelling and myalgias.  Skin:  Negative for rash.  Neurological:  Negative for dizziness, syncope, light-headedness, numbness and headaches.  Hematological:  Negative for adenopathy.  Psychiatric/Behavioral:  Negative for agitation, confusion, sleep disturbance and suicidal ideas. The patient is not nervous/anxious.      Objective: BP 122/72   Ht '5\' 6"'$  (1.676 m)   Wt 215 lb (97.5 kg)   LMP 12/16/2017   BMI 34.70 kg/m    Physical Exam Constitutional:      Appearance: She is well-developed.  Genitourinary:     Vulva normal.     Right Labia: No rash, tenderness or lesions.    Left Labia: No tenderness, lesions or rash.    No vaginal discharge, erythema or tenderness.      Right Adnexa: not tender and no mass present.    Left Adnexa: not tender and no mass present.    No cervical friability or polyp.     Uterus is not enlarged or tender.  Breasts:    Right: No mass, nipple discharge, skin change or tenderness.     Left: No mass, nipple discharge, skin change or tenderness.  Neck:     Thyroid: No thyromegaly.  Cardiovascular:     Rate and Rhythm: Normal rate and regular rhythm.     Heart sounds: Normal heart sounds. No murmur heard. Pulmonary:     Effort: Pulmonary effort is normal.     Breath sounds: Normal breath sounds.  Abdominal:     Palpations: Abdomen is soft.     Tenderness: There is no abdominal tenderness. There is no guarding or rebound.  Musculoskeletal:        General: Normal range of motion.     Cervical back: Normal range of motion.   Lymphadenopathy:     Cervical: No cervical adenopathy.  Neurological:     General: No focal deficit present.     Mental Status: She is alert and oriented to person, place, and time.     Cranial Nerves: No cranial nerve deficit.  Skin:    General: Skin is warm and dry.  Psychiatric:        Mood and Affect: Mood normal.        Behavior: Behavior normal.        Thought Content: Thought content normal.  Judgment: Judgment normal.  Vitals reviewed.     Assessment/Plan: Encounter for annual routine gynecological examination  Encounter for screening mammogram for malignant neoplasm of breast - Plan: MM 3D SCREEN BREAST BILATERAL; pt to schedule mammo  Pre-diabetes--pt does labs with PCP for insurance coverage  Anxiety--doing well. F/u prn.            GYN counsel breast self exam, mammography screening, menopause, adequate intake of calcium and vitamin D, diet and exercise     F/U  Return in about 1 year (around 02/05/2023).  Aashna Matson B. Kyshawn Teal, PA-C 02/04/2022 4:05 PM

## 2022-03-09 ENCOUNTER — Ambulatory Visit
Admission: RE | Admit: 2022-03-09 | Discharge: 2022-03-09 | Disposition: A | Discharge status: Home / Self Care | Payer: BC Managed Care – PPO | Source: Ambulatory Visit | Attending: Obstetrics and Gynecology | Admitting: Obstetrics and Gynecology

## 2022-03-09 DIAGNOSIS — Z1231 Encounter for screening mammogram for malignant neoplasm of breast: Secondary | ICD-10-CM | POA: Insufficient documentation

## 2022-03-10 ENCOUNTER — Other Ambulatory Visit: Payer: BC Managed Care – PPO

## 2022-03-10 ENCOUNTER — Telehealth: Payer: Self-pay

## 2022-03-10 DIAGNOSIS — Z0000 Encounter for general adult medical examination without abnormal findings: Secondary | ICD-10-CM

## 2022-03-10 DIAGNOSIS — Z13220 Encounter for screening for lipoid disorders: Secondary | ICD-10-CM

## 2022-03-10 DIAGNOSIS — Z131 Encounter for screening for diabetes mellitus: Secondary | ICD-10-CM

## 2022-03-10 DIAGNOSIS — R7303 Prediabetes: Secondary | ICD-10-CM

## 2022-03-10 NOTE — Telephone Encounter (Signed)
Pt came in office for labs. Zero labs in her chart, [facility]'s notes say pre-diabetes labs to be done at PCP for insurance coverage. Pt said last time her labs were covered here. Can you pls clarify? Pt says phone call or mychar msg is fine.

## 2022-03-10 NOTE — Telephone Encounter (Signed)
Lab orders placed. Pt can schedule appt at our office at her convenience. Thx.

## 2022-03-15 NOTE — Telephone Encounter (Signed)
Patient has been scheduled for labs

## 2022-03-15 NOTE — Telephone Encounter (Signed)
Called pt, no answer, left detailed msg lab orders are in and she can schedule appt at her convenience.

## 2022-03-22 ENCOUNTER — Other Ambulatory Visit: Payer: BC Managed Care – PPO

## 2022-03-22 DIAGNOSIS — Z131 Encounter for screening for diabetes mellitus: Secondary | ICD-10-CM

## 2022-03-22 DIAGNOSIS — Z13220 Encounter for screening for lipoid disorders: Secondary | ICD-10-CM

## 2022-03-22 DIAGNOSIS — R7303 Prediabetes: Secondary | ICD-10-CM

## 2022-03-22 DIAGNOSIS — Z0000 Encounter for general adult medical examination without abnormal findings: Secondary | ICD-10-CM

## 2022-03-23 LAB — COMPREHENSIVE METABOLIC PANEL
ALT: 22 IU/L (ref 0–32)
AST: 23 IU/L (ref 0–40)
Albumin/Globulin Ratio: 1.7 (ref 1.2–2.2)
Albumin: 4.3 g/dL (ref 3.8–4.9)
Alkaline Phosphatase: 86 IU/L (ref 44–121)
BUN/Creatinine Ratio: 19 (ref 9–23)
BUN: 18 mg/dL (ref 6–24)
Bilirubin Total: 0.3 mg/dL (ref 0.0–1.2)
CO2: 25 mmol/L (ref 20–29)
Calcium: 9.6 mg/dL (ref 8.7–10.2)
Chloride: 104 mmol/L (ref 96–106)
Creatinine, Ser: 0.93 mg/dL (ref 0.57–1.00)
Globulin, Total: 2.5 g/dL (ref 1.5–4.5)
Glucose: 91 mg/dL (ref 70–99)
Potassium: 4.4 mmol/L (ref 3.5–5.2)
Sodium: 140 mmol/L (ref 134–144)
Total Protein: 6.8 g/dL (ref 6.0–8.5)
eGFR: 73 mL/min/{1.73_m2} (ref >59)

## 2022-03-23 LAB — LIPID PANEL
Chol/HDL Ratio: 2.7 ratio (ref 0.0–4.4)
Cholesterol, Total: 186 mg/dL (ref 100–199)
HDL: 69 mg/dL (ref >39)
LDL Chol Calc (NIH): 99 mg/dL (ref 0–99)
Triglycerides: 101 mg/dL (ref 0–149)
VLDL Cholesterol Cal: 18 mg/dL (ref 5–40)

## 2022-03-23 LAB — HEMOGLOBIN A1C
Est. average glucose Bld gHb Est-mCnc: 123 mg/dL
Hgb A1c MFr Bld: 5.9 % — ABNORMAL HIGH (ref 4.8–5.6)

## 2023-02-21 NOTE — Progress Notes (Unsigned)
No chief complaint on file.   HPI:      Ms. Stacey Case is a 57 y.o. G0P0000 who LMP was Patient's last menstrual period was 12/16/2017., presents today for her annual examination. Her menses have been absent for over a year. They were absent since 6/21 but had 1 episode light spotting/PMB after covid shot 11/21, EM=1.7 mm on GYN u/s.  Hx of endocx polyp with polypectomy 2016 with Dr. Jean Rosenthal. She does have tolerable vasomotor sx.    Sex activity: single partner, contraception - none. Hx of infertility. She does have vaginal dryness, has not tried lubricants.   Last Pap: 05/14/20 (done early due to PMB);  Results were: no abnormalities /neg HPV DNA 2019 Hx of STDs: none   Last mammogram: 03/09/22  Results were: normal--routine follow-up in 12 months. 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 There is no FH of breast cancer. There is no FH of ovarian cancer. The patient does self-breast exams.   Colonoscopy: colonoscopy 2021 without abnormalities. FH of polyps so has colonoscopy Q5 yrs.   Tobacco use: The patient denies current or previous tobacco use. Alcohol use: none No drug use Exercise: moderately active   Had anxiety and trouble sleeping in past, triggered by increased job stress. Wanted prn med and given Rx lorazepam. Only took twice, no side effects. Now does melatonin or doxylamine with sx control.   She does get adequate calcium and Vitamin D in her diet. WNL lipids 2019/2021 but mild pre-DM 2019-2023. Due for lab rechk with PCP for insurance coverage.   Past Medical History:  Diagnosis Date   Abnormal uterine bleeding (AUB)    Breast mass in female 49   LEFT   FH: infertility    History of mammogram 4/22/14L 11/12/15   BIRAD 1; NEG   History of Papanicolaou smear of cervix 09/20/11; 10/29/14   -/-; -/-   Lichen sclerosus    VULVA   Menorrhagia     Past Surgical History:  Procedure Laterality Date   BREAST BIOPSY Left 11/19/2014   bx/clip- neg   COLONOSCOPY  07/11/2014   POLYPS -  HYPERPLASIA; DR. Jerre Simon   COMBINED HYSTEROSCOPY DIAGNOSTIC / D&C  03/26/2014   SDJ   ENDOCERVICAL POLYPECTOMY  03/26/2014   SDJ    Family History  Problem Relation Age of Onset   Diabetes Maternal Grandfather        TYPE 2   Breast cancer Neg Hx     Social History   Socioeconomic History   Marital status: Married    Spouse name: Not on file   Number of children: 1   Years of education: 16   Highest education level: Not on file  Occupational History   Occupation: Runner, broadcasting/film/video  Tobacco Use   Smoking status: Never   Smokeless tobacco: Never  Vaping Use   Vaping status: Never Used  Substance and Sexual Activity   Alcohol use: No   Drug use: No   Sexual activity: Yes    Birth control/protection: None  Other Topics Concern   Not on file  Social History Narrative   Not on file   Social Determinants of Health   Financial Resource Strain: Not on file  Food Insecurity: Not on file  Transportation Needs: Not on file  Physical Activity: Not on file  Stress: Not on file  Social Connections: Not on file  Intimate Partner Violence: Not on file    Current Outpatient Medications on File Prior to Visit  Medication Sig Dispense  Refill   Cholecalciferol (VITAMIN D3 PO) Take 1 tablet by mouth daily.     loratadine (CLARITIN) 10 MG tablet Take 10 mg by mouth daily.     LORazepam (ATIVAN) 0.5 MG tablet Take 1 tablet (0.5 mg total) by mouth 2 (two) times daily as needed for anxiety. 30 tablet 0   Multiple Vitamin (MULTI-VITAMIN DAILY) TABS Take by mouth.     No current facility-administered medications on file prior to visit.    ROS:  Review of Systems  Constitutional:  Negative for fatigue, fever and unexpected weight change.  Respiratory:  Negative for cough, shortness of breath and wheezing.   Cardiovascular:  Negative for chest pain, palpitations and leg swelling.  Gastrointestinal:  Negative for blood in stool, constipation, diarrhea, nausea and vomiting.  Endocrine:  Negative for cold intolerance, heat intolerance and polyuria.  Genitourinary:  Negative for dyspareunia, dysuria, flank pain, frequency, genital sores, hematuria, menstrual problem, pelvic pain, urgency, vaginal bleeding, vaginal discharge and vaginal pain.  Musculoskeletal:  Negative for back pain, joint swelling and myalgias.  Skin:  Negative for rash.  Neurological:  Negative for dizziness, syncope, light-headedness, numbness and headaches.  Hematological:  Negative for adenopathy.  Psychiatric/Behavioral:  Negative for agitation, confusion, sleep disturbance and suicidal ideas. The patient is not nervous/anxious.      Objective: LMP 12/16/2017    Physical Exam Constitutional:      Appearance: She is well-developed.  Genitourinary:     Vulva normal.     Right Labia: No rash, tenderness or lesions.    Left Labia: No tenderness, lesions or rash.    No vaginal discharge, erythema or tenderness.      Right Adnexa: not tender and no mass present.    Left Adnexa: not tender and no mass present.    No cervical friability or polyp.     Uterus is not enlarged or tender.  Breasts:    Right: No mass, nipple discharge, skin change or tenderness.     Left: No mass, nipple discharge, skin change or tenderness.  Neck:     Thyroid: No thyromegaly.  Cardiovascular:     Rate and Rhythm: Normal rate and regular rhythm.     Heart sounds: Normal heart sounds. No murmur heard. Pulmonary:     Effort: Pulmonary effort is normal.     Breath sounds: Normal breath sounds.  Abdominal:     Palpations: Abdomen is soft.     Tenderness: There is no abdominal tenderness. There is no guarding or rebound.  Musculoskeletal:        General: Normal range of motion.     Cervical back: Normal range of motion.  Lymphadenopathy:     Cervical: No cervical adenopathy.  Neurological:     General: No focal deficit present.     Mental Status: She is alert and oriented to person, place, and time.     Cranial  Nerves: No cranial nerve deficit.  Skin:    General: Skin is warm and dry.  Psychiatric:        Mood and Affect: Mood normal.        Behavior: Behavior normal.        Thought Content: Thought content normal.        Judgment: Judgment normal.  Vitals reviewed.     Assessment/Plan: Encounter for annual routine gynecological examination  Encounter for screening mammogram for malignant neoplasm of breast - Plan: MM 3D SCREEN BREAST BILATERAL; pt to schedule mammo  Pre-diabetes--pt does labs with  PCP for insurance coverage  Anxiety--doing well. F/u prn.            GYN counsel breast self exam, mammography screening, menopause, adequate intake of calcium and vitamin D, diet and exercise     F/U  No follow-ups on file.  Buford Bremer B. Ammi Hutt, PA-C 02/21/2023 1:50 PM

## 2023-02-22 ENCOUNTER — Encounter: Payer: Self-pay | Admitting: Obstetrics and Gynecology

## 2023-02-22 ENCOUNTER — Other Ambulatory Visit (HOSPITAL_COMMUNITY)
Admission: RE | Admit: 2023-02-22 | Discharge: 2023-02-22 | Disposition: A | Discharge status: Home / Self Care | Payer: BC Managed Care – PPO | Source: Ambulatory Visit | Attending: Obstetrics and Gynecology | Admitting: Obstetrics and Gynecology

## 2023-02-22 ENCOUNTER — Ambulatory Visit (INDEPENDENT_AMBULATORY_CARE_PROVIDER_SITE_OTHER): Payer: BC Managed Care – PPO | Admitting: Obstetrics and Gynecology

## 2023-02-22 VITALS — BP 118/88 | Ht 65.0 in | Wt 214.0 lb

## 2023-02-22 DIAGNOSIS — Z1231 Encounter for screening mammogram for malignant neoplasm of breast: Secondary | ICD-10-CM

## 2023-02-22 DIAGNOSIS — Z01419 Encounter for gynecological examination (general) (routine) without abnormal findings: Secondary | ICD-10-CM

## 2023-02-22 DIAGNOSIS — Z124 Encounter for screening for malignant neoplasm of cervix: Secondary | ICD-10-CM

## 2023-02-22 DIAGNOSIS — Z1151 Encounter for screening for human papillomavirus (HPV): Secondary | ICD-10-CM | POA: Diagnosis present

## 2023-02-22 DIAGNOSIS — R7303 Prediabetes: Secondary | ICD-10-CM

## 2023-02-22 DIAGNOSIS — Z0000 Encounter for general adult medical examination without abnormal findings: Secondary | ICD-10-CM

## 2023-02-22 DIAGNOSIS — Z13220 Encounter for screening for lipoid disorders: Secondary | ICD-10-CM

## 2023-02-22 DIAGNOSIS — Z131 Encounter for screening for diabetes mellitus: Secondary | ICD-10-CM

## 2023-02-22 DIAGNOSIS — F419 Anxiety disorder, unspecified: Secondary | ICD-10-CM

## 2023-02-22 NOTE — Patient Instructions (Signed)
I value your feedback and you entrusting us with your care. If you get a [facility] patient survey, I would appreciate you taking the time to let us know about your experience today. Thank you! ? ? ?

## 2023-02-24 LAB — CYTOLOGY - PAP
Comment: NEGATIVE
Diagnosis: NEGATIVE
High risk HPV: NEGATIVE

## 2023-03-18 ENCOUNTER — Other Ambulatory Visit: Payer: BC Managed Care – PPO

## 2023-03-18 ENCOUNTER — Ambulatory Visit
Admission: RE | Admit: 2023-03-18 | Discharge: 2023-03-18 | Disposition: A | Discharge status: Home / Self Care | Payer: BC Managed Care – PPO | Source: Ambulatory Visit | Attending: Obstetrics and Gynecology | Admitting: Obstetrics and Gynecology

## 2023-03-18 DIAGNOSIS — Z1231 Encounter for screening mammogram for malignant neoplasm of breast: Secondary | ICD-10-CM | POA: Diagnosis present

## 2023-03-18 DIAGNOSIS — Z0000 Encounter for general adult medical examination without abnormal findings: Secondary | ICD-10-CM

## 2023-03-18 DIAGNOSIS — R7303 Prediabetes: Secondary | ICD-10-CM

## 2023-03-18 DIAGNOSIS — Z13220 Encounter for screening for lipoid disorders: Secondary | ICD-10-CM

## 2023-03-18 DIAGNOSIS — Z131 Encounter for screening for diabetes mellitus: Secondary | ICD-10-CM

## 2023-03-19 LAB — COMPREHENSIVE METABOLIC PANEL
ALT: 24 [IU]/L (ref 0–32)
AST: 22 [IU]/L (ref 0–40)
Albumin: 4.5 g/dL (ref 3.8–4.9)
Alkaline Phosphatase: 90 [IU]/L (ref 44–121)
BUN/Creatinine Ratio: 16 (ref 9–23)
BUN: 15 mg/dL (ref 6–24)
Bilirubin Total: 0.4 mg/dL (ref 0.0–1.2)
CO2: 26 mmol/L (ref 20–29)
Calcium: 9.8 mg/dL (ref 8.7–10.2)
Chloride: 102 mmol/L (ref 96–106)
Creatinine, Ser: 0.94 mg/dL (ref 0.57–1.00)
Globulin, Total: 2.5 g/dL (ref 1.5–4.5)
Glucose: 95 mg/dL (ref 70–99)
Potassium: 4.2 mmol/L (ref 3.5–5.2)
Sodium: 141 mmol/L (ref 134–144)
Total Protein: 7 g/dL (ref 6.0–8.5)
eGFR: 71 mL/min/{1.73_m2} (ref >59)

## 2023-03-19 LAB — HEMOGLOBIN A1C
Est. average glucose Bld gHb Est-mCnc: 126 mg/dL
Hgb A1c MFr Bld: 6 % — ABNORMAL HIGH (ref 4.8–5.6)

## 2023-03-19 LAB — LIPID PANEL
Chol/HDL Ratio: 2.9 {ratio} (ref 0.0–4.4)
Cholesterol, Total: 193 mg/dL (ref 100–199)
HDL: 66 mg/dL (ref >39)
LDL Chol Calc (NIH): 102 mg/dL — ABNORMAL HIGH (ref 0–99)
Triglycerides: 142 mg/dL (ref 0–149)
VLDL Cholesterol Cal: 25 mg/dL (ref 5–40)

## 2023-03-22 ENCOUNTER — Other Ambulatory Visit: Payer: Self-pay | Admitting: Obstetrics and Gynecology

## 2023-03-22 DIAGNOSIS — R928 Other abnormal and inconclusive findings on diagnostic imaging of breast: Secondary | ICD-10-CM

## 2023-03-22 DIAGNOSIS — R921 Mammographic calcification found on diagnostic imaging of breast: Secondary | ICD-10-CM

## 2023-03-23 ENCOUNTER — Encounter: Payer: Self-pay | Admitting: Obstetrics and Gynecology

## 2023-03-23 ENCOUNTER — Telehealth: Payer: Self-pay | Admitting: Obstetrics and Gynecology

## 2023-03-23 NOTE — Telephone Encounter (Signed)
Patient called today, you sent her a mychart message about more breast exam screening.  [place] in in the need of you to sign off on the orders.  Will you please call the patient back with more details.

## 2023-03-24 NOTE — Telephone Encounter (Signed)
Patient called today, waiting for information in regards to more imaging at [facility].  Please give the patient a call when there is a resoultion.

## 2023-03-25 ENCOUNTER — Other Ambulatory Visit: Payer: BC Managed Care – PPO

## 2023-03-25 ENCOUNTER — Ambulatory Visit
Admission: RE | Admit: 2023-03-25 | Discharge: 2023-03-25 | Disposition: A | Discharge status: Home / Self Care | Payer: BC Managed Care – PPO | Source: Ambulatory Visit | Attending: Obstetrics and Gynecology | Admitting: Obstetrics and Gynecology

## 2023-03-25 DIAGNOSIS — R928 Other abnormal and inconclusive findings on diagnostic imaging of breast: Secondary | ICD-10-CM | POA: Diagnosis present

## 2023-03-25 DIAGNOSIS — R921 Mammographic calcification found on diagnostic imaging of breast: Secondary | ICD-10-CM

## 2023-03-28 NOTE — Telephone Encounter (Signed)
Spoke with pt. Has already had addl imaging done and results followed up with her.

## 2023-05-19 IMAGING — MG MM DIGITAL SCREENING BILAT W/ TOMO AND CAD
8 series · 8 of 24 positions shown · non-contrast
Comparison: Previous exam(s).

CLINICAL DATA: Screening.

EXAM:
DIGITAL SCREENING BILATERAL MAMMOGRAM WITH TOMOSYNTHESIS AND CAD
TECHNIQUE: Bilateral screening digital craniocaudal and mediolateral oblique
mammograms were obtained. Bilateral screening digital breast
tomosynthesis was performed. The images were evaluated with
computer-aided detection.

[R CC synth-2D]
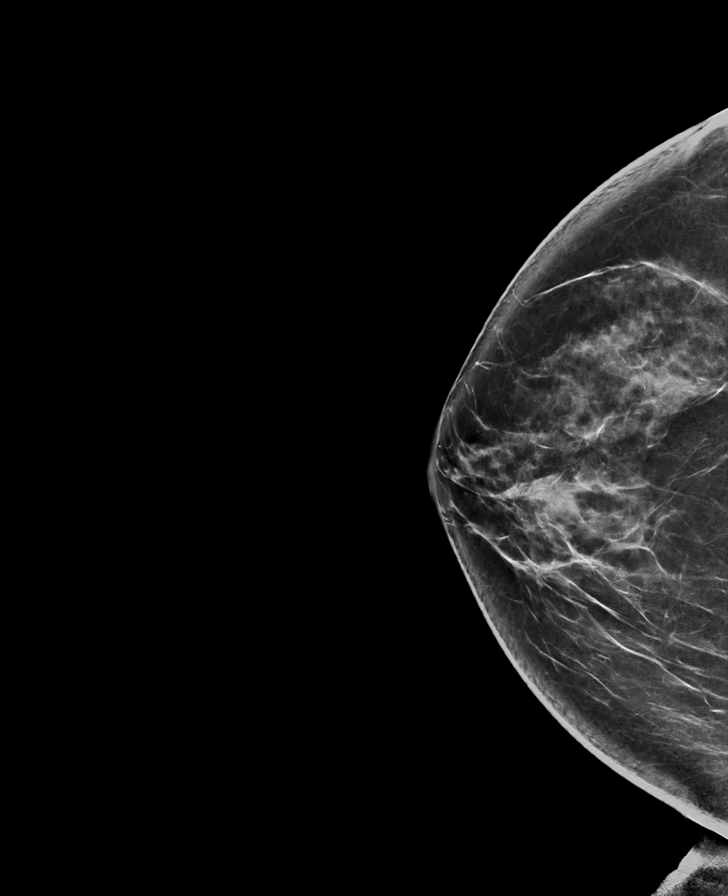

[R MLO synth-2D]
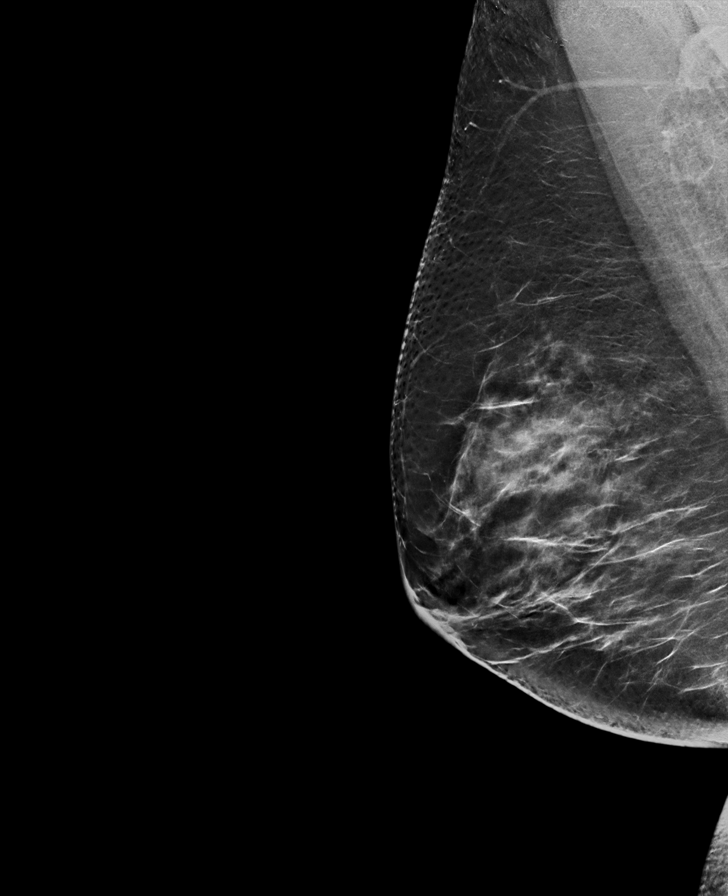

[L CC synth-2D]
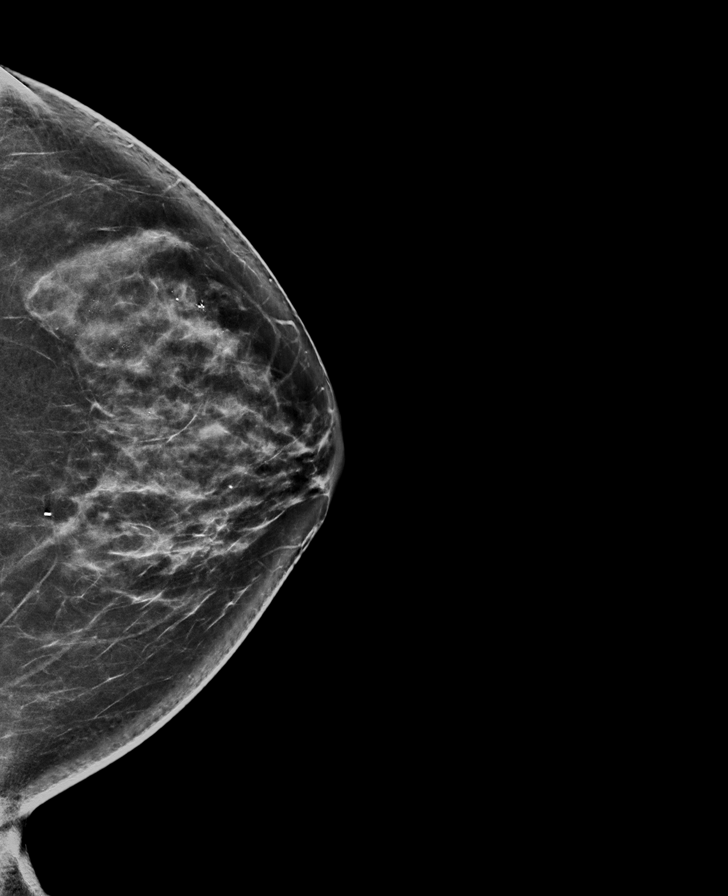

[L MLO synth-2D]
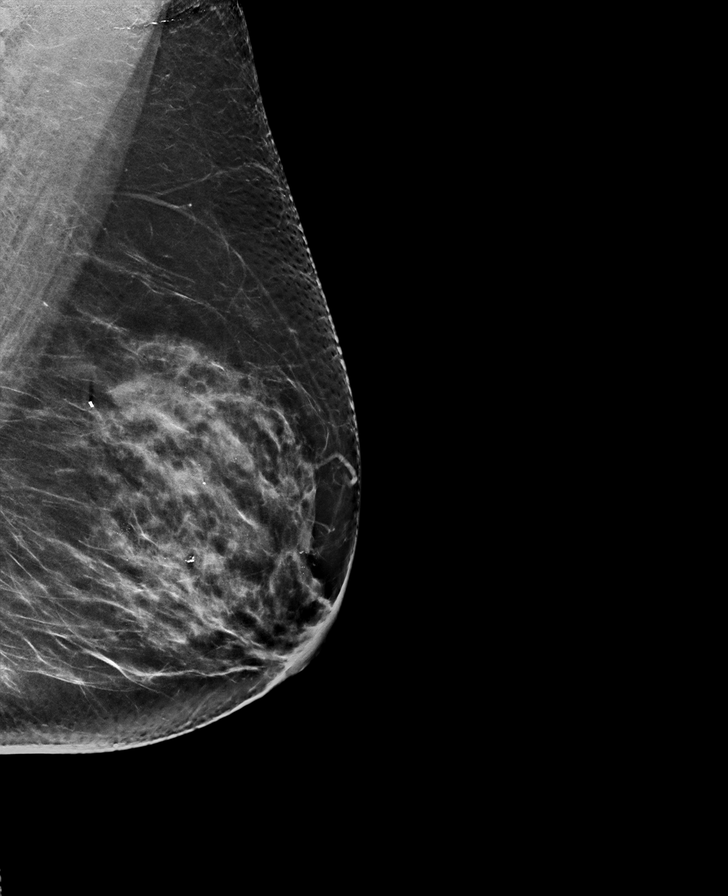

[R MLO tomo · tomo slice 43/86.0]
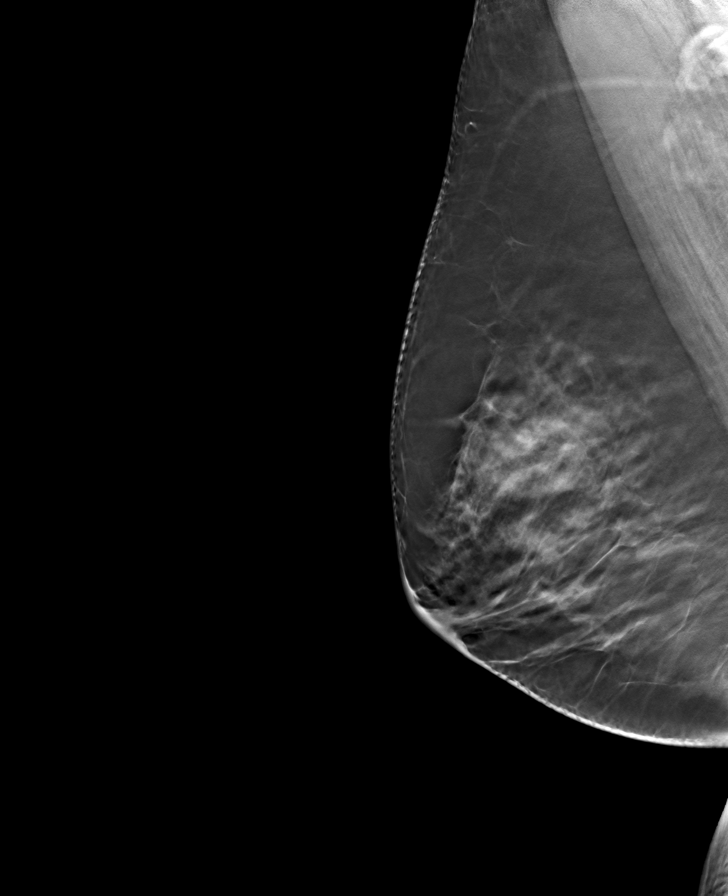

[L CC tomo · tomo slice 43/84.0]
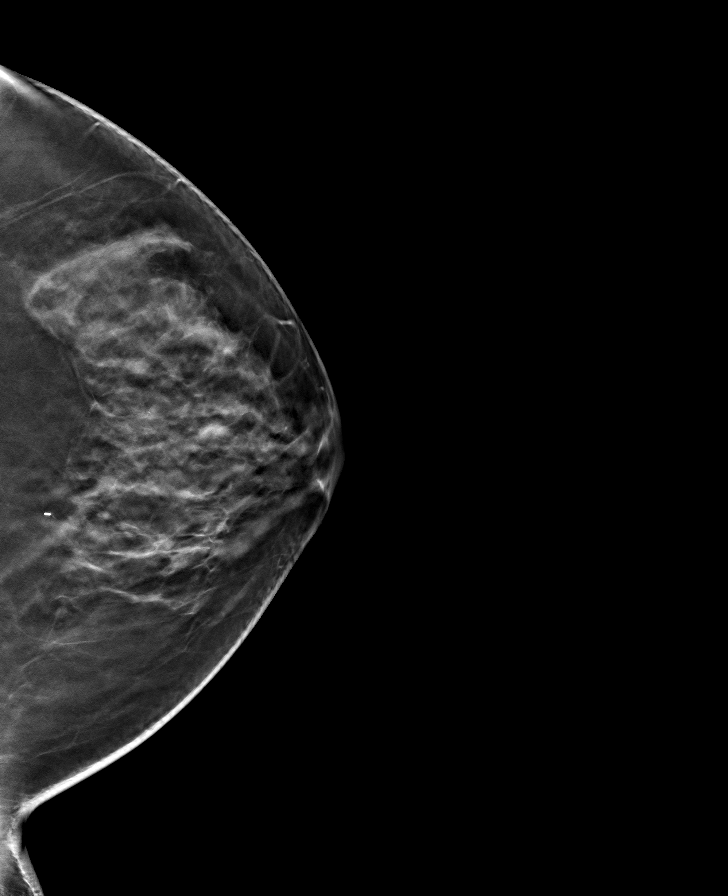

[L MLO tomo · tomo slice 41/82.0]
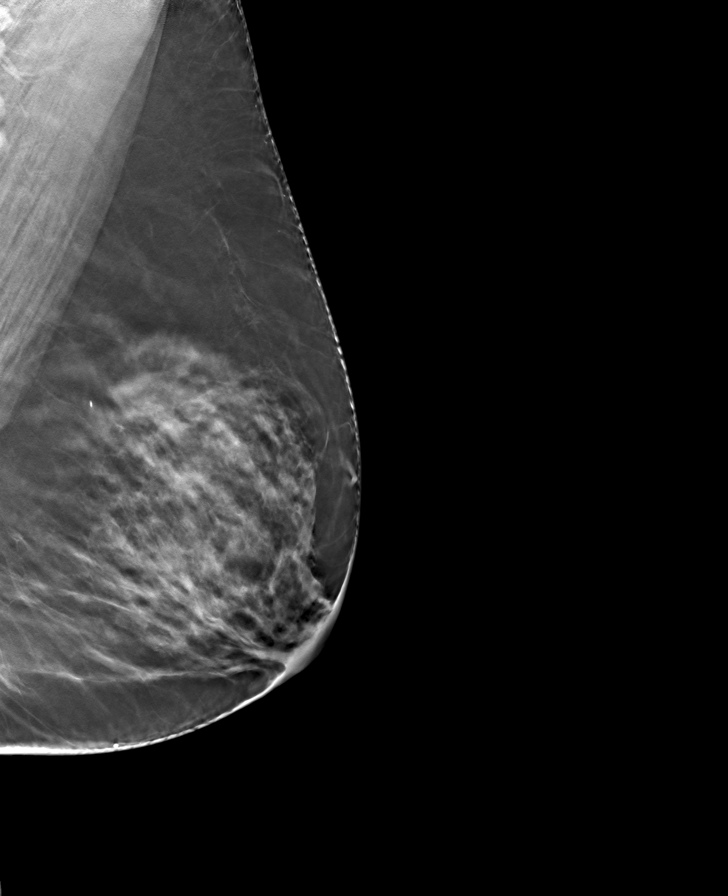

[R CC tomo · tomo slice 43/84.0]
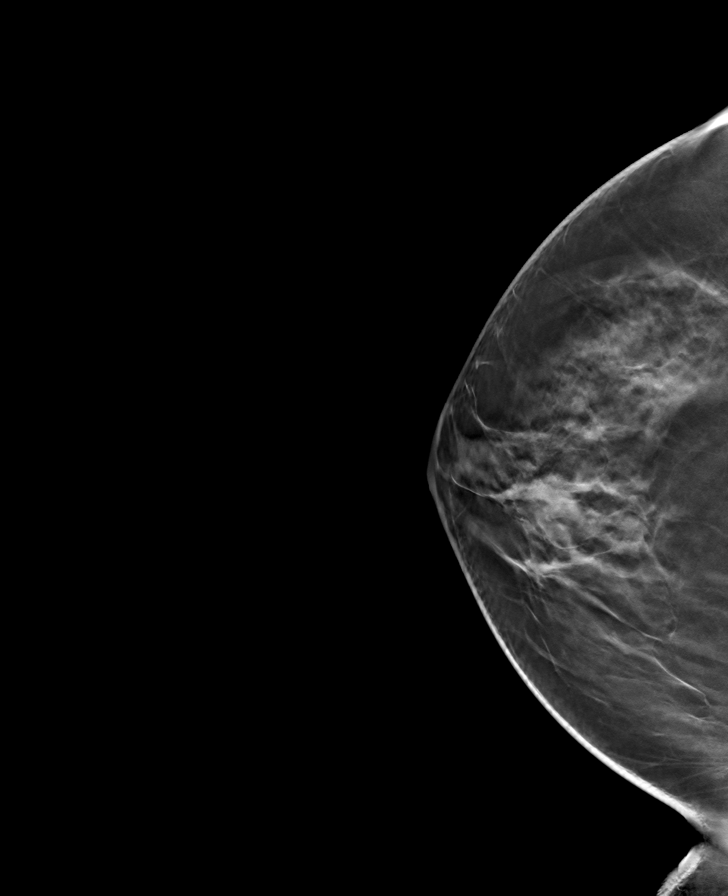

[8 of 24 positions shown; findings below may reference images not displayed]

ACR Breast Density Category c: The breast tissue is heterogeneously
dense, which may obscure small masses.
FINDINGS: There are no findings suspicious for malignancy.
IMPRESSION: No mammographic evidence of malignancy. A result letter of this
screening mammogram will be mailed directly to the patient.

RECOMMENDATION:
Screening mammogram in one year. (Code:Q3-W-BC3)

BI-RADS CATEGORY  1: Negative.

## 2024-02-26 NOTE — Progress Notes (Signed)
 Chief Complaint  Patient presents with   Gynecologic Exam    No concerns    HPI:      Stacey Case is a 58 y.o. G0P0000 who LMP was Patient's last menstrual period was 12/16/2017., presents today for her annual examination. Her menses are absent due to menopause. No PMB/pelvic pain.  Hx of endocx polyp with polypectomy 2016 with Dr. Leonce. She does have tolerable vasomotor sx; improved from last yr.    Sex activity: single partner, contraception - menopause. She does not have vaginal dryness/pain/bleeding.   Last Pap: 02/22/23; Results were: no abnormalities /neg HPV DNA Hx of STDs: none   Last mammogram: 03/25/23 Results were: normal--routine follow-up in 12 months. 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 There is no FH of breast cancer. There is no FH of ovarian cancer. The patient does self-breast exams. Noticed a breast lump LT breast a couple wks ago but not there recently. Hx of fibrocystic breasts, drinks caffeine.    Colonoscopy: colonoscopy ~09/2019 without abnormalities. FH of polyps so has colonoscopy Q5 yrs.   Tobacco use: The patient denies current or previous tobacco use. Alcohol use: none No drug use Exercise: moderately active   She does get adequate calcium and Vitamin D in her diet. WNL lipids 2019/2021/2024 but mild pre-DM 2019-2024. Due for lab recheck, not fasting today.   Past Medical History:  Diagnosis Date   Abnormal uterine bleeding (AUB)    Breast mass in female 28   LEFT   FH: infertility    History of mammogram 4/22/14L 11/12/15   BIRAD 1; NEG   History of Papanicolaou smear of cervix 09/20/11; 10/29/14   -/-; -/-   Lichen sclerosus    VULVA   Menorrhagia     Past Surgical History:  Procedure Laterality Date   BREAST BIOPSY Left 11/19/2014   bx/clip- neg   COLONOSCOPY  07/11/2014   POLYPS - HYPERPLASIA; DR. METTA   COMBINED HYSTEROSCOPY DIAGNOSTIC / D&C  03/26/2014   SDJ   ENDOCERVICAL POLYPECTOMY  03/26/2014   SDJ    Family History  Problem  Relation Age of Onset   Diabetes Maternal Grandfather        TYPE 2   Breast cancer Neg Hx     Social History   Socioeconomic History   Marital status: Married    Spouse name: Not on file   Number of children: 1   Years of education: 16   Highest education level: Not on file  Occupational History   Occupation: Runner, broadcasting/film/video  Tobacco Use   Smoking status: Never   Smokeless tobacco: Never  Vaping Use   Vaping status: Never Used  Substance and Sexual Activity   Alcohol use: No   Drug use: No   Sexual activity: Yes    Birth control/protection: Post-menopausal  Other Topics Concern   Not on file  Social History Narrative   Not on file   Social Drivers of Health   Financial Resource Strain: Not on file  Food Insecurity: Not on file  Transportation Needs: Not on file  Physical Activity: Not on file  Stress: Not on file  Social Connections: Not on file  Intimate Partner Violence: Not on file    Current Outpatient Medications on File Prior to Visit  Medication Sig Dispense Refill   Cholecalciferol (VITAMIN D3 PO) Take 1 tablet by mouth daily.     fluticasone (FLONASE) 50 MCG/ACT nasal spray Place 1 spray into the nose.     loratadine (  CLARITIN) 10 MG tablet Take 10 mg by mouth daily.     Multiple Vitamin (MULTI-VITAMIN DAILY) TABS Take by mouth.     No current facility-administered medications on file prior to visit.    ROS:  Review of Systems  Constitutional:  Negative for fatigue, fever and unexpected weight change.  Respiratory:  Negative for cough, shortness of breath and wheezing.   Cardiovascular:  Negative for chest pain, palpitations and leg swelling.  Gastrointestinal:  Negative for blood in stool, constipation, diarrhea, nausea and vomiting.  Endocrine: Negative for cold intolerance, heat intolerance and polyuria.  Genitourinary:  Negative for dyspareunia, dysuria, flank pain, frequency, genital sores, hematuria, menstrual problem, pelvic pain, urgency, vaginal  bleeding, vaginal discharge and vaginal pain.  Musculoskeletal:  Negative for back pain, joint swelling and myalgias.  Skin:  Negative for rash.  Neurological:  Negative for dizziness, syncope, light-headedness, numbness and headaches.  Hematological:  Negative for adenopathy.  Psychiatric/Behavioral:  Negative for agitation, confusion, sleep disturbance and suicidal ideas. The patient is not nervous/anxious.      Objective: BP 127/77   Pulse 71   Ht 5' 6 (1.676 m)   Wt 220 lb (99.8 kg)   LMP 12/16/2017   BMI 35.51 kg/m    Physical Exam Constitutional:      Appearance: She is well-developed.  Genitourinary:     Vulva normal.     Right Labia: No rash, tenderness or lesions.    Left Labia: No tenderness, lesions or rash.    No vaginal discharge, erythema or tenderness.      Right Adnexa: not tender and no mass present.    Left Adnexa: not tender and no mass present.    No cervical friability or polyp.     Uterus is not enlarged or tender.  Breasts:    Right: No mass, nipple discharge, skin change or tenderness.     Left: No mass, nipple discharge, skin change or tenderness.  Neck:     Thyroid: No thyromegaly.  Cardiovascular:     Rate and Rhythm: Normal rate and regular rhythm.     Heart sounds: Normal heart sounds. No murmur heard. Pulmonary:     Effort: Pulmonary effort is normal.     Breath sounds: Normal breath sounds.  Chest:    Abdominal:     Palpations: Abdomen is soft.     Tenderness: There is no abdominal tenderness. There is no guarding or rebound.  Musculoskeletal:        General: Normal range of motion.     Cervical back: Normal range of motion.  Lymphadenopathy:     Cervical: No cervical adenopathy.  Neurological:     General: No focal deficit present.     Mental Status: She is alert and oriented to person, place, and time.     Cranial Nerves: No cranial nerve deficit.  Skin:    General: Skin is warm and dry.  Psychiatric:        Mood and  Affect: Mood normal.        Behavior: Behavior normal.        Thought Content: Thought content normal.        Judgment: Judgment normal.  Vitals reviewed.     Assessment/Plan: Encounter for annual routine gynecological examination  Encounter for screening mammogram for malignant neoplasm of breast - Plan: MM 3D SCREENING MAMMOGRAM BILATERAL BREAST; pt to schedule mammo  Screening for colon cancer--pt will call [facility] 1/26 to schedule  Blood tests for routine  general physical examination - Plan: CBC with Differential/Platelet, Comprehensive metabolic panel with GFR, Hemoglobin A1c, Lipid panel  Screening cholesterol level - Plan: Lipid panel  Pre-diabetes - Plan: Hemoglobin A1c           GYN counsel breast self exam, mammography screening, menopause, adequate intake of calcium and vitamin D, diet and exercise     F/U  Return in about 1 year (around 02/27/2025).  Drusilla Wampole B. Riane Rung, PA-C 02/28/2024 3:47 PM

## 2024-02-27 ENCOUNTER — Ambulatory Visit: Payer: Self-pay | Admitting: Obstetrics and Gynecology

## 2024-02-28 ENCOUNTER — Ambulatory Visit: Payer: Self-pay | Admitting: Obstetrics and Gynecology

## 2024-02-28 ENCOUNTER — Encounter: Payer: Self-pay | Admitting: Obstetrics and Gynecology

## 2024-02-28 VITALS — BP 127/77 | HR 71 | Ht 66.0 in | Wt 220.0 lb

## 2024-02-28 DIAGNOSIS — Z1211 Encounter for screening for malignant neoplasm of colon: Secondary | ICD-10-CM

## 2024-02-28 DIAGNOSIS — Z13220 Encounter for screening for lipoid disorders: Secondary | ICD-10-CM

## 2024-02-28 DIAGNOSIS — R7303 Prediabetes: Secondary | ICD-10-CM

## 2024-02-28 DIAGNOSIS — Z01419 Encounter for gynecological examination (general) (routine) without abnormal findings: Secondary | ICD-10-CM

## 2024-02-28 DIAGNOSIS — Z1231 Encounter for screening mammogram for malignant neoplasm of breast: Secondary | ICD-10-CM

## 2024-02-28 DIAGNOSIS — Z0000 Encounter for general adult medical examination without abnormal findings: Secondary | ICD-10-CM

## 2024-02-28 NOTE — Patient Instructions (Addendum)
 I value your feedback and you entrusting [place] with your care. If you get a [facility] patient survey, I would appreciate you taking the time to let us know about your experience today. Thank you!  [facility] ([place])--[phone]

## 2024-05-09 ENCOUNTER — Ambulatory Visit
Admission: RE | Admit: 2024-05-09 | Discharge: 2024-05-09 | Disposition: A | Discharge status: Home / Self Care | Source: Ambulatory Visit | Attending: Obstetrics and Gynecology | Admitting: Obstetrics and Gynecology

## 2024-05-09 ENCOUNTER — Other Ambulatory Visit

## 2024-05-09 DIAGNOSIS — R7303 Prediabetes: Secondary | ICD-10-CM

## 2024-05-09 DIAGNOSIS — Z0000 Encounter for general adult medical examination without abnormal findings: Secondary | ICD-10-CM

## 2024-05-09 DIAGNOSIS — Z13220 Encounter for screening for lipoid disorders: Secondary | ICD-10-CM

## 2024-05-09 DIAGNOSIS — Z1231 Encounter for screening mammogram for malignant neoplasm of breast: Secondary | ICD-10-CM | POA: Insufficient documentation

## 2024-05-10 LAB — COMPREHENSIVE METABOLIC PANEL WITH GFR
ALT: 23 IU/L (ref 0–32)
AST: 24 IU/L (ref 0–40)
Albumin: 4.6 g/dL (ref 3.8–4.9)
Alkaline Phosphatase: 90 IU/L (ref 49–135)
BUN/Creatinine Ratio: 19 (ref 9–23)
BUN: 17 mg/dL (ref 6–24)
Bilirubin Total: 0.3 mg/dL (ref 0.0–1.2)
CO2: 27 mmol/L (ref 20–29)
Calcium: 9.5 mg/dL (ref 8.7–10.2)
Chloride: 101 mmol/L (ref 96–106)
Creatinine, Ser: 0.88 mg/dL (ref 0.57–1.00)
Globulin, Total: 2.5 g/dL (ref 1.5–4.5)
Glucose: 94 mg/dL (ref 70–99)
Potassium: 4.3 mmol/L (ref 3.5–5.2)
Sodium: 141 mmol/L (ref 134–144)
Total Protein: 7.1 g/dL (ref 6.0–8.5)
eGFR: 77 mL/min/1.73 (ref >59)

## 2024-05-10 LAB — CBC WITH DIFFERENTIAL/PLATELET
Basophils Absolute: 0 x10E3/uL (ref 0.0–0.2)
Basos: 1 %
EOS (ABSOLUTE): 0.2 x10E3/uL (ref 0.0–0.4)
Eos: 5 %
Hematocrit: 39.2 % (ref 34.0–46.6)
Hemoglobin: 13.1 g/dL (ref 11.1–15.9)
Immature Grans (Abs): 0 x10E3/uL (ref 0.0–0.1)
Immature Granulocytes: 0 %
Lymphocytes Absolute: 1.4 x10E3/uL (ref 0.7–3.1)
Lymphs: 30 %
MCH: 32.3 pg (ref 26.6–33.0)
MCHC: 33.4 g/dL (ref 31.5–35.7)
MCV: 97 fL (ref 79–97)
Monocytes Absolute: 0.4 x10E3/uL (ref 0.1–0.9)
Monocytes: 9 %
Neutrophils Absolute: 2.5 x10E3/uL (ref 1.4–7.0)
Neutrophils: 55 %
Platelets: 236 x10E3/uL (ref 150–450)
RBC: 4.05 x10E6/uL (ref 3.77–5.28)
RDW: 11.9 % (ref 11.7–15.4)
WBC: 4.5 x10E3/uL (ref 3.4–10.8)

## 2024-05-10 LAB — LIPID PANEL
Chol/HDL Ratio: 2.8 ratio (ref 0.0–4.4)
Cholesterol, Total: 177 mg/dL (ref 100–199)
HDL: 63 mg/dL (ref >39)
LDL Chol Calc (NIH): 96 mg/dL (ref 0–99)
Triglycerides: 97 mg/dL (ref 0–149)
VLDL Cholesterol Cal: 18 mg/dL (ref 5–40)

## 2024-05-10 LAB — HEMOGLOBIN A1C
Est. average glucose Bld gHb Est-mCnc: 123 mg/dL
Hgb A1c MFr Bld: 5.9 % — ABNORMAL HIGH (ref 4.8–5.6)

## 2024-05-13 ENCOUNTER — Ambulatory Visit: Payer: Self-pay | Admitting: Obstetrics and Gynecology
